# Patient Record
Sex: Male | Born: 1970 | Race: White | Hispanic: No | Marital: Married
Health system: Midwestern US, Academic
[De-identification: ages and names within clinical notes are randomized; demographics above are authoritative.]

## PROBLEM LIST (undated history)

## (undated) DIAGNOSIS — E785 Hyperlipidemia, unspecified: Secondary | ICD-10-CM

## (undated) DIAGNOSIS — K625 Hemorrhage of anus and rectum: Secondary | ICD-10-CM

## (undated) DIAGNOSIS — D126 Benign neoplasm of colon, unspecified: Secondary | ICD-10-CM

## (undated) DIAGNOSIS — M519 Unspecified thoracic, thoracolumbar and lumbosacral intervertebral disc disorder: Secondary | ICD-10-CM

## (undated) DIAGNOSIS — I10 Essential (primary) hypertension: Secondary | ICD-10-CM

## (undated) DIAGNOSIS — M1990 Unspecified osteoarthritis, unspecified site: Secondary | ICD-10-CM

## (undated) DIAGNOSIS — M549 Dorsalgia, unspecified: Secondary | ICD-10-CM

## (undated) DIAGNOSIS — K648 Other hemorrhoids: Secondary | ICD-10-CM

## (undated) DIAGNOSIS — F329 Major depressive disorder, single episode, unspecified: Secondary | ICD-10-CM

## (undated) HISTORY — DX: Hyperlipidemia, unspecified: E78.5

## (undated) HISTORY — DX: Dorsalgia, unspecified: M54.9

## (undated) HISTORY — PX: COLONOSCOPY: SHX174

## (undated) HISTORY — DX: Other hemorrhoids: K64.8

## (undated) HISTORY — DX: Unspecified osteoarthritis, unspecified site: M19.90

## (undated) HISTORY — DX: Major depressive disorder, single episode, unspecified: F32.9

## (undated) HISTORY — DX: Hemorrhage of anus and rectum: K62.5

## (undated) HISTORY — PX: TONSILLECTOMY AND ADENOIDECTOMY: SHX28

## (undated) HISTORY — DX: Benign neoplasm of colon, unspecified: D12.6

## (undated) HISTORY — DX: Unspecified thoracic, thoracolumbar and lumbosacral intervertebral disc disorder: M51.9

## (undated) HISTORY — PX: POLYPECTOMY: SHX149

## (undated) HISTORY — PX: BACK SURGERY: SHX140

## (undated) HISTORY — DX: Essential (primary) hypertension: I10

---

## 1998-09-15 ENCOUNTER — Emergency Department (HOSPITAL_COMMUNITY): Admission: EM | Admit: 1998-09-15 | Discharge: 1998-09-15 | Payer: Self-pay | Admitting: Internal Medicine

## 2001-06-18 ENCOUNTER — Emergency Department (HOSPITAL_COMMUNITY): Admission: EM | Admit: 2001-06-18 | Discharge: 2001-06-18 | Payer: Self-pay

## 2002-06-07 ENCOUNTER — Emergency Department (HOSPITAL_COMMUNITY): Admission: EM | Admit: 2002-06-07 | Discharge: 2002-06-07 | Payer: Self-pay | Admitting: Emergency Medicine

## 2002-06-07 ENCOUNTER — Encounter: Payer: Self-pay | Admitting: Emergency Medicine

## 2003-06-25 ENCOUNTER — Emergency Department (HOSPITAL_COMMUNITY): Admission: EM | Admit: 2003-06-25 | Discharge: 2003-06-25 | Payer: Self-pay | Admitting: *Deleted

## 2003-09-15 ENCOUNTER — Emergency Department (HOSPITAL_COMMUNITY): Admission: EM | Admit: 2003-09-15 | Discharge: 2003-09-15 | Payer: Self-pay | Admitting: Emergency Medicine

## 2004-09-10 ENCOUNTER — Encounter: Admission: RE | Admit: 2004-09-10 | Discharge: 2004-09-10 | Payer: Self-pay | Admitting: Unknown Physician Specialty

## 2004-09-30 ENCOUNTER — Inpatient Hospital Stay (HOSPITAL_COMMUNITY): Admission: RE | Admit: 2004-09-30 | Discharge: 2004-10-03 | Payer: Self-pay | Admitting: Neurological Surgery

## 2004-09-30 ENCOUNTER — Ambulatory Visit: Payer: Self-pay | Admitting: Cardiology

## 2004-10-13 ENCOUNTER — Emergency Department (HOSPITAL_COMMUNITY): Admission: EM | Admit: 2004-10-13 | Discharge: 2004-10-13 | Payer: Self-pay | Admitting: Emergency Medicine

## 2004-11-02 ENCOUNTER — Encounter: Admission: RE | Admit: 2004-11-02 | Discharge: 2004-11-02 | Payer: Self-pay | Admitting: Neurological Surgery

## 2004-11-04 ENCOUNTER — Emergency Department (HOSPITAL_COMMUNITY): Admission: EM | Admit: 2004-11-04 | Discharge: 2004-11-04 | Payer: Self-pay | Admitting: Emergency Medicine

## 2004-11-09 ENCOUNTER — Emergency Department (HOSPITAL_COMMUNITY): Admission: EM | Admit: 2004-11-09 | Discharge: 2004-11-09 | Payer: Self-pay | Admitting: Emergency Medicine

## 2004-11-12 ENCOUNTER — Ambulatory Visit (HOSPITAL_COMMUNITY): Admission: RE | Admit: 2004-11-12 | Discharge: 2004-11-12 | Payer: Self-pay | Admitting: Family Medicine

## 2004-11-13 ENCOUNTER — Encounter: Admission: RE | Admit: 2004-11-13 | Discharge: 2004-11-13 | Payer: Self-pay | Admitting: Neurological Surgery

## 2004-12-08 ENCOUNTER — Encounter: Admission: RE | Admit: 2004-12-08 | Discharge: 2005-01-04 | Payer: Self-pay | Admitting: Neurological Surgery

## 2005-01-01 ENCOUNTER — Encounter: Admission: RE | Admit: 2005-01-01 | Discharge: 2005-01-01 | Payer: Self-pay | Admitting: Neurological Surgery

## 2005-01-04 ENCOUNTER — Encounter: Admission: RE | Admit: 2005-01-04 | Discharge: 2005-01-04 | Payer: Self-pay | Admitting: Neurological Surgery

## 2005-02-12 ENCOUNTER — Emergency Department (HOSPITAL_COMMUNITY): Admission: EM | Admit: 2005-02-12 | Discharge: 2005-02-12 | Payer: Self-pay | Admitting: Emergency Medicine

## 2005-02-24 ENCOUNTER — Encounter: Admission: RE | Admit: 2005-02-24 | Discharge: 2005-02-24 | Payer: Self-pay | Admitting: Neurological Surgery

## 2005-05-10 ENCOUNTER — Encounter: Admission: RE | Admit: 2005-05-10 | Discharge: 2005-05-10 | Payer: Self-pay | Admitting: Neurological Surgery

## 2005-05-17 ENCOUNTER — Encounter: Admission: RE | Admit: 2005-05-17 | Discharge: 2005-05-17 | Payer: Self-pay | Admitting: Neurological Surgery

## 2005-07-12 ENCOUNTER — Encounter: Admission: RE | Admit: 2005-07-12 | Discharge: 2005-07-12 | Payer: Self-pay | Admitting: Neurological Surgery

## 2005-08-16 ENCOUNTER — Encounter: Admission: RE | Admit: 2005-08-16 | Discharge: 2005-08-16 | Payer: Self-pay | Admitting: Neurological Surgery

## 2005-08-26 ENCOUNTER — Encounter: Admission: RE | Admit: 2005-08-26 | Discharge: 2005-11-24 | Payer: Self-pay | Admitting: Neurological Surgery

## 2005-09-21 ENCOUNTER — Encounter: Admission: RE | Admit: 2005-09-21 | Discharge: 2005-09-21 | Payer: Self-pay | Admitting: Neurological Surgery

## 2005-10-28 ENCOUNTER — Emergency Department (HOSPITAL_COMMUNITY): Admission: EM | Admit: 2005-10-28 | Discharge: 2005-10-29 | Payer: Self-pay | Admitting: Emergency Medicine

## 2005-11-01 ENCOUNTER — Encounter: Admission: RE | Admit: 2005-11-01 | Discharge: 2005-11-01 | Payer: Self-pay | Admitting: Neurological Surgery

## 2005-12-21 ENCOUNTER — Ambulatory Visit: Payer: Self-pay | Admitting: Pain Medicine

## 2006-01-03 ENCOUNTER — Ambulatory Visit: Payer: Self-pay | Admitting: Pain Medicine

## 2006-01-06 ENCOUNTER — Ambulatory Visit: Payer: Self-pay | Admitting: Pain Medicine

## 2006-01-19 ENCOUNTER — Encounter: Payer: Self-pay | Admitting: Pain Medicine

## 2006-01-24 ENCOUNTER — Ambulatory Visit: Payer: Self-pay | Admitting: Pain Medicine

## 2006-01-27 ENCOUNTER — Ambulatory Visit: Payer: Self-pay | Admitting: Pain Medicine

## 2006-01-29 ENCOUNTER — Encounter: Payer: Self-pay | Admitting: Pain Medicine

## 2006-02-14 ENCOUNTER — Ambulatory Visit: Payer: Self-pay | Admitting: Pain Medicine

## 2006-05-20 ENCOUNTER — Emergency Department (HOSPITAL_COMMUNITY): Admission: EM | Admit: 2006-05-20 | Discharge: 2006-05-20 | Payer: Self-pay | Admitting: Emergency Medicine

## 2006-06-07 ENCOUNTER — Encounter: Admission: RE | Admit: 2006-06-07 | Discharge: 2006-06-07 | Payer: Self-pay | Admitting: Neurological Surgery

## 2006-06-09 ENCOUNTER — Encounter: Admission: RE | Admit: 2006-06-09 | Discharge: 2006-06-09 | Payer: Self-pay | Admitting: Neurological Surgery

## 2007-05-02 ENCOUNTER — Emergency Department (HOSPITAL_COMMUNITY): Admission: EM | Admit: 2007-05-02 | Discharge: 2007-05-02 | Payer: Self-pay | Admitting: Emergency Medicine

## 2007-05-04 ENCOUNTER — Ambulatory Visit: Payer: Self-pay | Admitting: Gastroenterology

## 2007-05-22 ENCOUNTER — Encounter: Payer: Self-pay | Admitting: Gastroenterology

## 2007-05-22 ENCOUNTER — Ambulatory Visit (HOSPITAL_COMMUNITY): Admission: RE | Admit: 2007-05-22 | Discharge: 2007-05-22 | Payer: Self-pay | Admitting: Gastroenterology

## 2007-05-22 DIAGNOSIS — D126 Benign neoplasm of colon, unspecified: Secondary | ICD-10-CM

## 2007-05-22 HISTORY — DX: Benign neoplasm of colon, unspecified: D12.6

## 2007-05-29 ENCOUNTER — Ambulatory Visit: Payer: Self-pay | Admitting: Gastroenterology

## 2007-05-31 NOTE — Unmapped (Signed)
Signed by Eusebio Friendly MD on 05/31/2007 at 14:32:25    Prescriptions:  ADDERALL XR 15 MG CP24 (AMPHETAMINE-DEXTROAMPHETAMINE) one by mouth daily  #90 x 0   Entered and Authorized by: Eusebio Friendly MD   Signed by: Eusebio Friendly MD on 05/31/2007   Method used: Handwritten   RxID: [id number]

## 2007-06-17 DIAGNOSIS — K625 Hemorrhage of anus and rectum: Secondary | ICD-10-CM

## 2007-06-17 DIAGNOSIS — K648 Other hemorrhoids: Secondary | ICD-10-CM | POA: Insufficient documentation

## 2007-06-17 DIAGNOSIS — M549 Dorsalgia, unspecified: Secondary | ICD-10-CM | POA: Insufficient documentation

## 2007-06-17 HISTORY — DX: Hemorrhage of anus and rectum: K62.5

## 2007-06-17 HISTORY — DX: Other hemorrhoids: K64.8

## 2007-06-17 HISTORY — DX: Dorsalgia, unspecified: M54.9

## 2007-10-31 NOTE — Unmapped (Signed)
Signed by Tracie Harrier on 11/01/2007 at 15:29:53    Phone Note   Patient Call  [facility] Cell Phone #: [phone]  Caller: patient  Call for: Dr Suezanne Cheshire     Reason for Call: refill medication  Summary of Call: Would like written script for Adderall 15 mg, 1 x a day, 90 day supply. Please call patient back and advise at [phone] when script is ready for pickup. Thank you.    Current Allergies:   NKA      Initial call taken by: Brand Males Back,  October 31, 2007 1:05 PM      Follow-up for Phone Call   left message for patient  Follow-up by: Tracie Harrier,  November 01, 2007 3:29 PM    Prescriptions:  ADDERALL XR 15 MG CP24 (AMPHETAMINE-DEXTROAMPHETAMINE) one by mouth daily  #90 x 0   Entered and Authorized by: Eusebio Friendly MD   Signed by: Eusebio Friendly MD on 11/01/2007   Method used: Handwritten   RxID: [id number]

## 2007-11-01 NOTE — Unmapped (Signed)
Signed by Eusebio Friendly MD on 11/01/2007 at 00:00:00  Rx Refill adderall      Imported By: Dorena Cookey 11/01/2007 15:39:02    _____________________________________________________________________    External Attachment:    Please see [facility] EMR for this document.

## 2007-12-27 NOTE — Unmapped (Signed)
Signed by Eugenie Birks MA on 12/27/2007 at 13:18:40    Phone Note   Patient Call  Call back at Home Phone: [phone]  Caller: spouse - Dawn  Call for: Suezanne Cheshire    Reason for Call: returning call to provider  Summary of Call: Pt exposed to whooping cough.  Tetanus was given in 1999.  Does pt need another one?     Initial call taken by: Eugenie Birks MA,  December 27, 2007 12:16 PM      New Medications:  ZITHROMAX Z-PAK TABS (AZITHROMYCIN TABS)     Follow-up for Phone Call   Pt needs ADACEL ASAP to bump up pertusis immunity.  Please tell the patient that prescription will be called into pharmacy.   Follow-up by: Eusebio Friendly MD,  December 27, 2007 12:18 PM    Additional Follow-up for Phone Call   left message for patient, Rx completed  Additional Follow-up by: Eugenie Birks MA,  December 27, 2007 1:18 PM    Prescriptions:  Christena Deem Z-PAK TABS (AZITHROMYCIN TABS)   #1 x 0   Entered and Authorized by: Eusebio Friendly MD   Signed by: Eusebio Friendly MD on 12/27/2007   Method used: Telephoned to ...        RxID: [id number]

## 2007-12-27 NOTE — Unmapped (Signed)
Signed by Eugenie Birks MA on 12/27/2007 at 17:10:16    Phone Note   Patient Call  Call back at Home Phone: [phone]  Caller: patient  Call for: donna    Reason for Call: speak with nurse  Summary of Call: does pt need to have a tetanus shot today? no labs are left, do you want to fit him in?  please call pt to advise.     Initial call taken by: Clementeen Graham,  December 27, 2007 2:31 PM      Follow-up for Phone Call   phone call completed, appointment scheduled today  Follow-up by: Eugenie Birks MA,  December 27, 2007 5:09 PM

## 2007-12-27 NOTE — Unmapped (Addendum)
Signed by Eugenie Birks MA on 12/27/2007 at 17:12:45        Allergies  Allergies have not been documented for this patient    Medications               Assessment and Plan   Today's Orders   Admin Fee Immun lst Inj. [CPT-90471]  Tetanus vaccine  (V03.7) [CPT-90703]                Tetanus/Td Vaccine     Vaccine Type: Adacel     Date/Time: 12/27/2007 5:11 PM     Site: right deltoid     Mfr: [employer]     Dose: 0.5 ml     Route: IM     Given by: Eugenie Birks MA     Exp. Date: 05/02/2010     Lot #: [id number]     VIS given: 12/09/04 version given December 27, 2007.      Signed by Eugenie Birks MA on 12/28/2007 at 14:47:14            Signed by Eugenie Birks MA on 12/28/2007 at 15:22:40              Allergies  Allergies have not been documented for this patient    Medications               Assessment and Plan   Today's Orders   Tdap vacc    79-39 years old  (V06.1) [CPT-90715]

## 2008-01-24 ENCOUNTER — Inpatient Hospital Stay (HOSPITAL_COMMUNITY): Admission: EM | Admit: 2008-01-24 | Discharge: 2008-01-25 | Payer: Self-pay | Admitting: Emergency Medicine

## 2008-01-30 NOTE — Unmapped (Signed)
Signed by Beverley Fiedler on 01/31/2008 at 08:56:03    PHONE NOTE - Patient Call    Call back at Home Phone: [phone]  Caller: patient  Department: Family Medicine  Call for: DR [facility]    Reason for Call: refill medication. PT CALLED AND SAYS THAT SHE NEEDS A WRITTEN SCRIPT FOR THE FOLLOWING MEDICATION   ADDERALL XR 15 MG CP24 (AMPHETAMINE-DEXTROAMPHETAMINE) one by mouth daily  #90 x 0  PLZ CALL WHEN READY       Initial call taken by: Fleet Contras,  January 30, 2008 2:52 PM    Current Allergies:   NKA      FOLLOW UP  Ask pt to be seen soon.  I have not seen him since 01-09-07.  Please have patient pick-up written prescription at the front desk.       Follow-up by: Eusebio Friendly MD,  January 31, 2008 8:24 AM    ADDITIONAL FOLLOW UP  Left message for patient to pick up script.  Follow-up by: Beverley Fiedler,  January 31, 2008 8:55 AM    Prescriptions:  ADDERALL XR 15 MG CP24 (AMPHETAMINE-DEXTROAMPHETAMINE) one by mouth daily  #30 x 0   Entered and Authorized by: Eusebio Friendly MD   Signed by: Eusebio Friendly MD on 01/31/2008   Method used: Print then Give to Patient   RxID: [id number]

## 2008-02-20 NOTE — Unmapped (Signed)
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  Social History: Marital Status: divorced,   Children: 1,   Caffeine per Day: 1  Alcohol Use: occasionally  Drug Use: none  Tobacco Usage:non-smoker

## 2008-02-21 NOTE — Unmapped (Signed)
Signed by Eusebio Friendly MD on 02/21/2008 at 12:01:01      Reason for Visit   Chief Complaint: Check up     History from: patient    Allergies  ! PCN    Medications   ADDERALL XR 15 MG CP24 (AMPHETAMINE-DEXTROAMPHETAMINE) one by mouth daily       Vital Signs:   Wt: 182 lbs.      Temperature: 98.0  degrees F  oral  Pulse: 68    Patient appears to be in acute distress: no  BP: 114/68    Intake recorded by: Eugenie Birks MA on February 21, 2008 11:53 AM    History of Present Illness   father in ill health due to liver problem with alcohol    sleep good, concentration OK      Past History  Family History: Father - alcoholic liver disease    Review of Systems  Refer to HPI for review of systems documentation.      Physical Examination:   BP: 114/  68           New Problems:  ADD (ICD-314.00)  New Medications:  IBUPROFEN 800 MG TABS (IBUPROFEN) one by mouth every 8 hours as needed  ROBAXIN 500 MG TABS (METHOCARBAMOL) one to two by mouth four times a day as needed spasm      Preventive Maintenance             Prescriptions:  ADDERALL XR 15 MG CP24 (AMPHETAMINE-DEXTROAMPHETAMINE) one by mouth daily  #90 x 0   Entered and Authorized by: Eusebio Friendly MD   Signed by: Eusebio Friendly MD on 02/21/2008   Method used: Print then Give to Patient   RxID: [id number]  ADDERALL XR 15 MG CP24 (AMPHETAMINE-DEXTROAMPHETAMINE) one by mouth daily  #30 x 0   Entered and Authorized by: Eusebio Friendly MD   Signed by: Eusebio Friendly MD on 02/21/2008   Method used: Print then Give to Patient   RxID: [id number]  ROBAXIN 500 MG TABS (METHOCARBAMOL) one to two by mouth four times a day as needed spasm  #40 x 3   Entered and Authorized by: Eusebio Friendly MD   Signed by: Eusebio Friendly MD on 02/21/2008   Method used: Print then Give to Patient   RxID: [id number]  IBUPROFEN 800 MG TABS (IBUPROFEN) one by mouth every 8 hours as needed  #100 x 3   Entered and Authorized by: Eusebio Friendly MD   Signed by: Eusebio Friendly MD on  02/21/2008   Method used: Print then Give to Patient   RxID: [id number]      Assessment and Plan   Problems  HYPERCHOLESTEROLEMIA, PURE (ICD-272.0) just got with new insurance  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     Problems New Problems:  Dx of ADD (ICD-314.00)  Onset: 02/21/2008    Medications   New Prescriptions/Refills:  ADDERALL XR 15 MG CP24 (AMPHETAMINE-DEXTROAMPHETAMINE) one by mouth daily  #90 x 0, 02/21/2008, Eusebio Friendly MD  ADDERALL XR 15 MG CP24 (AMPHETAMINE-DEXTROAMPHETAMINE) one by mouth daily  #30 x 0, 02/21/2008, Eusebio Friendly MD  ROBAXIN 500 MG TABS (METHOCARBAMOL) one to two by mouth four times a day as needed spasm  #40 x 3, 02/21/2008, Eusebio Friendly MD  IBUPROFEN 800 MG TABS (IBUPROFEN) one by mouth every 8 hours as needed  #100 x 3, 02/21/2008, Eusebio Friendly MD  Disposition:   Return to clinic for Doctor Visit in 6 month(s)   Appointment Reason: CHECK-UP 15 minutes ADD

## 2008-02-21 NOTE — Unmapped (Signed)
Signed by Eusebio Friendly MD on 02/26/2008 at 15:06:13        Allergies  ! PCN    Medications                       Preventive Maintenance               Assessment and Plan   Today's Orders   [phone] - Ofc Vst, Est Level II [WJX-91478]

## 2008-03-19 NOTE — Unmapped (Signed)
Signed by Eusebio Friendly MD on 03/19/2008 at 00:00:00  To Send Records to [facility]      Imported By: Ellene Route Hardig 03/27/2008 15:19:44    _____________________________________________________________________    External Attachment:    Please see [facility] EMR for this document.

## 2008-06-03 NOTE — Unmapped (Signed)
Signed by Jodell Cipro MA on 06/03/2008 at 16:48:45    PHONE NOTE - Patient Call    Caller's Cell Phone #: [phone]   Caller: patient  Department: Family Medicine  Call for: DR [facility]     Reason for Call: refill medication. PT CALLED AND SAYS THAT SHE NEEDS A WRITTEN SCRIPT FOR THE FOLLOWING MEDICATION   ADDERALL XR 15 MG CP24 (AMPHETAMINE-DEXTROAMPHETAMINE) one by mouth daily  #90 x 0  PLZ CALL WHEN READY [phone]          Initial call taken by: Fleet Contras,  June 03, 2008 10:56 AM    Current Allergies:   ! PCN    FOLLOW UP  Please have patient pick-up written prescription at the front desk.       Follow-up by: Eusebio Friendly MD,  June 03, 2008 4:27 PM    ADDITIONAL FOLLOW UP  CALLED PT AND LET HIM KNOW  Follow-up by: Jodell Cipro MA,  June 03, 2008 4:48 PM

## 2008-06-04 NOTE — Unmapped (Addendum)
Signed by Royston Sinner MA on 06/04/2008 at 15:32:22    Prescriptions:  ADDERALL XR 15 MG CP24 (AMPHETAMINE-DEXTROAMPHETAMINE) one by mouth daily  #30 x 0   Entered by: Royston Sinner MA   Authorized by: Kingsley Callander MD   Signed by: Royston Sinner MA on 06/04/2008   Method used: Print then Give to Patient   RxID: [id number]      Signed by Beverley Fiedler on 06/04/2008 at 15:34:18    Patient picked up script for 30 pills but said it should have been for 90.

## 2008-07-08 NOTE — Unmapped (Signed)
Signed by Jodell Cipro MA on 07/08/2008 at 13:50:18    PHONE NOTE  Caller's Cell Phone #: [phone]  Caller: patient  Call for: DR [facility]    Reason for Call: PT NEEDS REFILL ON ADDERALL XR 15 MG SIG: 1 by mouth daily #30 X0 REFILL.  CALL PT TO PICK UP RX.      Initial call taken by: Jodell Cipro MA,  July 08, 2008 1:35 PM      New Medications:  ADDERALL XR 15 MG CP24 (AMPHETAMINE-DEXTROAMPHETAMINE) one by mouth daily (OK fill 08/07/08)    FOLLOW UP  Please have patient pick-up written prescription at the front desk.       Follow-up by:  Eusebio Friendly MD,  July 08, 2008 1:36 PM    FOLLOW UP  left a message to let pt know script has been called in to the pharmacy  Follow-up by:  Daleen Snook,  July 08, 2008 1:40 PM    FOLLOW UP  CALLED PT AND LET HIM KNOW  Follow-up by:  Jodell Cipro MA,  July 08, 2008 1:50 PM    Prescriptions:  ADDERALL XR 15 MG CP24 (AMPHETAMINE-DEXTROAMPHETAMINE) one by mouth daily (OK fill 08/07/08)  #30 x 0   Entered and Authorized by: Eusebio Friendly MD   Signed by: Eusebio Friendly MD on 07/08/2008   Method used: Print then Give to Patient   RxID: [id number]  ADDERALL XR 15 MG CP24 (AMPHETAMINE-DEXTROAMPHETAMINE) one by mouth daily  #30 x 0   Entered and Authorized by: Eusebio Friendly MD   Signed by: Eusebio Friendly MD on 07/08/2008   Method used: Print then Give to Patient   RxID: [id number]

## 2008-09-12 NOTE — Unmapped (Signed)
Signed by Lonni Fix on 09/12/2008 at 14:02:03    PHONE NOTE  Call back at Home Phone: [phone]  Caller: patient  Department: Family Medicine  Call for: DR [facility]     Reason for Call: refill medication. PT CALLED AND SAYS THAT SHE NEEDS A WRITTEN SCRIPT FOR THE FOLLOWING MEDICATION PT NEEDS REFILL ON ADDERALL XR 15 MG SIG: 1 by mouth daily #30 X0 REFILL.  CALL PT TO PICK UP RX. [id number]        Initial call taken by: Fleet Contras,  September 12, 2008 11:30 AM    Current Allergies:   ! PCN    FOLLOW UP  Please schedule check-up soon.  Please have patient pick-up written prescription at the front desk.       Follow-up by:  Eusebio Friendly MD,  September 12, 2008 1:33 PM    FOLLOW UP  left message for patient to pick up script and schedule appt soon  Follow-up by:  Lonni Fix,  September 12, 2008 2:01 PM    Prescriptions:  ADDERALL XR 15 MG CP24 (AMPHETAMINE-DEXTROAMPHETAMINE) one by mouth daily (OK fill 08/07/08)  #30 x 0   Entered and Authorized by: Eusebio Friendly MD   Signed by: Eusebio Friendly MD on 09/12/2008   Method used: Print then Give to Patient   RxID: [id number]

## 2008-10-05 NOTE — Unmapped (Signed)
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 PCN      Preventive Maintenance               Tetanus/Td Vaccine     Vaccine Type: Historical     Date/Time: 10/05/2008 11:41 AM     Dose: 0.5 ml     Given by: Eugenie Birks MA     VIS given: 04/18/07 version given Oct 15, 2008.

## 2008-10-15 NOTE — Unmapped (Signed)
Signed by Eusebio Friendly MD on 10/15/2008 at 00:00:00  Privacy Notice      Imported By: Alona Bene 10/15/2008 15:59:07    _____________________________________________________________________    External Attachment:    Please see [facility] EMR for this document.

## 2008-10-15 NOTE — Unmapped (Signed)
Signed by Eusebio Friendly MD on 10/15/2008 at 12:00:55    Prescriptions:  ADDERALL XR 15 MG CP24 (AMPHETAMINE-DEXTROAMPHETAMINE) one by mouth daily (OK fill now)  #30 x 0   Entered and Authorized by: Eusebio Friendly MD   Signed by: Eusebio Friendly MD on 10/15/2008   Method used: Print then Give to Patient   RxID: [id number]  VALTREX 1 GM TABS (VALACYCLOVIR HCL) two by mouth at onset of cold sore then repeat twelve  hours later (4 total)  #30 x 1   Entered and Authorized by: Eusebio Friendly MD   Signed by: Eusebio Friendly MD on 10/15/2008   Method used: Print then Give to Patient   RxID: [id number]

## 2008-10-15 NOTE — Unmapped (Addendum)
Signed by Eusebio Friendly MD on 10/15/2008 at 11:58:55      Reason for Visit   Chief Complaint: Check up Hypercholesterolemia    History from: patient    Allergies  ! PCN    Medications   ADDERALL XR 15 MG CP24 (AMPHETAMINE-DEXTROAMPHETAMINE) one by mouth daily (OK fill 08/07/08)  IBUPROFEN 800 MG TABS (IBUPROFEN) one by mouth every 8 hours as needed  ROBAXIN 500 MG TABS (METHOCARBAMOL) one to two by mouth four times a day as needed spasm       Vital Signs:   Wt: 182 lbs.      Wt chg (lbs): 0  Temperature: 98.6  degrees F  oral  Pulse: 68    Patient appears to be in acute distress: no  BP: 116/64    Intake recorded by: Eugenie Birks MA on Oct 15, 2008 11:39 AM    History of Present Illness   had BW for life ins- thinks TG was about 425  mood and sleep good, ? wears off a little earlier in day but not problematic.  ROS pt denies: chest pain, shortness of breath, leg swelling  urinary problems, bowel symptoms  some trouble maintaining erection        Review of Systems  Refer to HPI for review of systems documentation.      Physical Examination:   BP: 116/  64    Physical Exam- Detail:   General Appearance: well-developed, well-nourished and in no acute distress.  Respiratory: Respiration un-labored.  Lung fields clear to auscultation.  No wheezing, rales, rhonchi or pleural rub.  Cardiac: S1 and S2 normal.  RRR without murmurs, rubs, gallops.  No JVD.  Psychiatric: Judgement and insight are within normal limits.  Alert and oriented x3.  No mood disorders noted, appropriate affect.           New Problems:  HYPERTRIGLYCERIDEMIA (ICD-272.1)  New Medications:  ADDERALL XR 15 MG CP24 (AMPHETAMINE-DEXTROAMPHETAMINE) one by mouth daily (OK fill now)  ADDERALL XR 15 MG CP24 (AMPHETAMINE-DEXTROAMPHETAMINE) one by mouth daily (OK fill 11/15/08)  VALTREX 1 GM TABS (VALACYCLOVIR HCL) two by mouth at onset of cold sore then repeat twelve  hours later (4 total)      Preventive Maintenance             Prescriptions:  VALTREX 1 GM TABS  (VALACYCLOVIR HCL) two by mouth at onset of cold sore then repeat twelve  hours later (4 total)  #30 x 1   Entered and Authorized by: Eusebio Friendly MD   Signed by: Eusebio Friendly MD on 10/15/2008   Method used: Print then Give to Patient   RxID: [id number]  ADDERALL XR 15 MG CP24 (AMPHETAMINE-DEXTROAMPHETAMINE) one by mouth daily (OK fill now)  #30 x 0   Entered and Authorized by: Eusebio Friendly MD   Signed by: Eusebio Friendly MD on 10/15/2008   Method used: Print then Give to Patient   RxID: [id number]  ADDERALL XR 15 MG CP24 (AMPHETAMINE-DEXTROAMPHETAMINE) one by mouth daily (OK fill 11/15/08)  #90 x 0   Entered and Authorized by: Eusebio Friendly MD   Signed by: Eusebio Friendly MD on 10/15/2008   Method used: Print then Give to Patient   RxID: [id number]  VALTREX 1 GM TABS (VALACYCLOVIR HCL) two by mouth at onset of cold sore then repeat twelve  hours later (4 total)  #8 x 1   Entered and Authorized by: Eusebio Friendly MD  Signed by: Eusebio Friendly MD on 10/15/2008   Method used: Print then Give to Patient   RxID: [id number]      Assessment and Plan   HYPERTRIGLYCERIDEMIA (ICD-272.1) ondiet, needs to check  ADD (ICD-314.00) stable with current medication regimen.   ? 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, had sugar ckd recently       Problems New Problems:  Dx of HYPERTRIGLYCERIDEMIA (ICD-272.1)  Onset: 10/15/2008    Medications   New Prescriptions/Refills:  VALTREX 1 GM TABS (VALACYCLOVIR HCL) two by mouth at onset of cold sore then repeat twelve  hours later (4 total)  #30 x 1, 10/15/2008, Eusebio Friendly MD  ADDERALL XR 15 MG CP24 (AMPHETAMINE-DEXTROAMPHETAMINE) one by mouth daily (OK fill now)  #30 x 0, 10/15/2008, Eusebio Friendly MD  ADDERALL XR 15 MG CP24 (AMPHETAMINE-DEXTROAMPHETAMINE) one by mouth daily (OK fill 11/15/08)  #90 x 0, 10/15/2008, Eusebio Friendly MD  VALTREX 1 GM TABS (VALACYCLOVIR HCL) two by mouth at onset of cold sore then repeat twelve  hours later (4 total)  #8 x 1,  10/15/2008, Eusebio Friendly MD    Today's Orders   [phone] - Ofc Vst, Est Level III [IOE-70350]    Disposition:   Return to clinic for Lab Appointment Reason: fasting blood work   Return to clinic for Doctor Visit in 6 month(s)   Appointment Reason: CHECK-UP 15 minutes                         Signed by Eusebio Friendly MD on 10/19/2008 at 10:33:38              Allergies  ! PCN    Medications                       Preventive Maintenance               Assessment and Plan   Today's Orders   Lipid Profile   (FATS) (7600) [CPT-80061]  Hepatic Function    (LIVP) (10256)  [CPT-80076]  Glucose   (GLU) (483) [CPT-82947]

## 2008-10-15 NOTE — Unmapped (Signed)
Signed by Eusebio Friendly MD on 10/15/2008 at 00:00:00  Disclosure      Imported By: Alona Bene 10/15/2008 15:58:36    _____________________________________________________________________    External Attachment:    Please see [facility] EMR for this document.

## 2008-10-19 LAB — HEPATIC FUNCTION PANEL
ALT: 12 units/L (ref 10–50)
AST: 15 units/L (ref 5–34)
Albumin: 4.6 g/dL (ref 3.5–5.2)
Alkaline Phosphatase: 73 units/L (ref 53–128)
Bilirubin, Direct: 0.3 mg/dL (ref 0.0–0.2)
Total Bilirubin: 0.8 mg/dL (ref 0.3–1.2)
Total Protein: 6.7 g/dL (ref 6.0–8.3)

## 2008-10-19 LAB — LIPID PANEL
Chol/HDL Ratio: 6.9 (ref 0.0–4.0)
Cholesterol, Total: 179 mg/dL (ref 0–200)
HDL: 26 mg/dL (ref >40)
Triglycerides: 119 mg/dL (ref 0–150)

## 2008-10-19 LAB — LIPOPROTEIN ELECTROPHORESIS: LDL Calculated: 129 mg/dL (ref 0–100)

## 2008-10-19 LAB — GLUCOSE, RANDOM: Glucose: 99 mg/dL (ref 70–110)

## 2008-10-19 NOTE — Unmapped (Signed)
Signed by Eusebio Friendly MD on 10/21/2008 at 15:47:08  Patient: Edward Tanner  Note: All result statuses are Final unless otherwise noted.    Tests: (1) HEPATIC (1040)    Order Note: FASTING STATUS:  UNKNOWN    Order Note:     T.PROTEIN                 6.7 g/dL                    1.6-1.0    ALBUMIN                   4.6 g/dL                    9.6-0.4    ALK PHOS                  73 U/L                      53-128    ALT                       12 U/L                      10-50    AST                       15 U/L                      5-34    T. BILI                   0.8 mg/dL                   5.4-0.9    D.BILI               [H]  0.3 mg/dL                   8.1-1.9    Note: An exclamation mark (!) indicates a result that was not dispersed into   the flowsheet.  Document Creation Date: 10/21/2008 11:33 AM  _______________________________________________________________________    (1) Order result status: Final  Collection or observation date-time: 10/19/2008 07:45  Requested date-time:   Receipt date-time: 10/19/2008 14:47  Reported date-time:   Referring Physician:    Ordering Physician: [name] M.D [facility])  Specimen Source:   Source: Lajuana Carry Order Number: [id number]  Lab site:

## 2008-10-19 NOTE — Unmapped (Signed)
Signed by Eusebio Friendly MD on 10/21/2008 at 15:47:08  Patient: Edward Tanner  Note: All result statuses are Final unless otherwise noted.    Tests: (1) GLUCOSE (1065)    Order Note: FASTING STATUS:  UNKNOWN    Order Note: Comments:     Confidential Patient Information: Accompanied are [facility] results that are being   delivered by [facility].  If you receive a clinical result for a patient that is not yours please   contact [facility] at [phone].    GLUCOSE                   99 mg/dL                    09-811    Note: An exclamation mark (!) indicates a result that was not dispersed into   the flowsheet.  Document Creation Date: 10/21/2008 11:33 AM  _______________________________________________________________________    (1) Order result status: Final  Collection or observation date-time: 10/19/2008 07:45  Requested date-time:   Receipt date-time: 10/19/2008 14:47  Reported date-time:   Referring Physician:    Ordering Physician: [name] M.D [facility])  Specimen Source:   Source: Lajuana Carry Order Number: [id number]  Lab site:

## 2008-10-19 NOTE — Unmapped (Signed)
Signed by Eugenie Birks MA on 10/21/2008 at 13:51:18        Allergies  ! PCN    Medications               Assessment and Plan   Today's Orders   Venipuncture [[id number]]              ]

## 2008-10-19 NOTE — Unmapped (Signed)
Signed by Eusebio Friendly MD on 10/21/2008 at 15:47:08  Patient: Edward Tanner  Note: All result statuses are Final unless otherwise noted.    Tests: (1) Calc LDL (1058)    Order Note: FASTING STATUS:  UNKNOWN    Order Note:     CLDL                 [H]  129 mg/dL                   0-981    Note: An exclamation mark (!) indicates a result that was not dispersed into   the flowsheet.  Document Creation Date: 10/21/2008 11:33 AM  _______________________________________________________________________    (1) Order result status: Final  Collection or observation date-time: 10/19/2008 07:45  Requested date-time:   Receipt date-time: 10/19/2008 14:47  Reported date-time:   Referring Physician:    Ordering Physician: [name] M.D [facility])  Specimen Source:   Source: Lajuana Carry Order Number: [id number]  Lab site:

## 2008-10-19 NOTE — Unmapped (Signed)
Signed by Eusebio Friendly MD on 10/21/2008 at 15:47:08  Patient: Edward Tanner  Note: All result statuses are Final unless otherwise noted.    Tests: (1) Lipid Panel (1055)    Order Note: FASTING STATUS:  UNKNOWN    Order Note:     CHOLESTEROL               179 mg/dL                   6-045    TRIGLYCERIDES             119 mg/dL                   4-098    CHOL/HDL RATIO       [H]  6.9                         0.0-4.0    HDL                  [L]  26 mg/dL                    >11    Note: An exclamation mark (!) indicates a result that was not dispersed into   the flowsheet.  Document Creation Date: 10/21/2008 11:33 AM  _______________________________________________________________________    (1) Order result status: Final  Collection or observation date-time: 10/19/2008 07:45  Requested date-time:   Receipt date-time: 10/19/2008 14:47  Reported date-time:   Referring Physician:    Ordering Physician: [name] M.D [facility])  Specimen Source:   Source: Lajuana Carry Order Number: [id number]  Lab site:

## 2008-10-21 NOTE — Unmapped (Signed)
Signed by Eusebio Friendly MD on 10/21/2008 at 15:48:08          [name], M.D.  [facility]  [address]  [place]  [address]  Phone: [phone]  Fax: [phone]     Oct 21, 2008      Edward Tanner  [address]    [place], [place] [address]                                                                                           RE:  TEST RESULTS   [name]--05-Jun-1970)         Dear [name]:      The following is an interpretation of your most recent tests.  Please take note of any instructions provided.  Electrolyte studies:  Blood sugar normal    Liver function studies:  Normal    Lipid panel:  Good          Triglyceride: 119   Cholesterol: 179   LDL, calculated: 129   HDL: 26   Chol/HDL%:  6.9       Additional Comments: Please schedule a complete physical in 6 months with Dr. Suezanne Cheshire.     Keep up the good work!           Sincerely,         Eusebio Friendly, M.D.

## 2008-10-31 ENCOUNTER — Emergency Department (HOSPITAL_COMMUNITY): Admission: EM | Admit: 2008-10-31 | Discharge: 2008-10-31 | Payer: Self-pay | Admitting: *Deleted

## 2008-11-15 NOTE — Unmapped (Signed)
Signed by Aleen Sells MA on 11/15/2008 at 11:25:56    PHONE NOTE  Caller's Cell Phone #: [phone]  Call for: Dr. Suezanne Cheshire    Reason for Call: refill medication. PT needs 30 Rx on Aderol. Says he mailed Rx for 90 day Rx and it wasn't received. If you need to speak with PT pls call at [phone]    Pharmacy Information: [facility] in [place]      Initial call taken by: Simon Rhein,  November 15, 2008 8:52 AM    Current Allergies:   ! PCN    FOLLOW UP  Please have patient pick-up written prescription at the front desk.       Follow-up by:  Eusebio Friendly MD,  November 15, 2008 9:22 AM    FOLLOW UP  patient advised  Follow-up by:  Aleen Sells MA,  November 15, 2008 11:25 AM    Prescriptions:  ADDERALL XR 15 MG CP24 (AMPHETAMINE-DEXTROAMPHETAMINE) one by mouth daily (OK fill now)  #30 x 0   Entered and Authorized by: Eusebio Friendly MD   Signed by: Eusebio Friendly MD on 11/15/2008   Method used: Print then Give to Patient   RxID: [id number]

## 2008-12-03 NOTE — Unmapped (Signed)
Signed by Eusebio Friendly MD on 12/03/2008 at 13:16:06      Reason for Visit   Chief Complaint: lweft ear pain and headache had drg yesterday    History from: patient    Allergies  ! PCN    Medications   ADDERALL XR 15 MG CP24 (AMPHETAMINE-DEXTROAMPHETAMINE) one by mouth daily (OK fill now)  IBUPROFEN 800 MG TABS (IBUPROFEN) one by mouth every 8 hours as needed  ROBAXIN 500 MG TABS (METHOCARBAMOL) one to two by mouth four times a day as needed spasm  VALTREX 1 GM TABS (VALACYCLOVIR HCL) two by mouth at onset of cold sore then repeat twelve  hours later (4 total)        Vital Signs:   Wt: 180 lbs.      Wt chg (lbs): -2  Temperature: 98.8  Pulse: 76 (regular)  BP: 120/80    Intake recorded by: Alanda Amass MA on December 03, 2008 1:01 PM    History of Present Illness   x 1 d, drainage x 1 d, sore, hurts to chew  HA, OTC advil 400 no help  no URI symptoms, no fever  [place]          Physical Examination:   BP: 120/  80    Physical Exam- Detail:   General Appearance: well-developed, well-nourished and in no acute distress.  Ears: R clear, Left canal red  Oropharynx: Normal appearance.  No erythema, exudate or mass. No tonsillar swelling.  Oral Cavity: Gums pink, good dentition.  Oral mucosa and tongue without lesions.  Respiratory: Respiration un-labored.  Lung fields clear to auscultation.  No wheezing, rales, rhonchi or pleural rub.  Neck: No thyromegaly.  No nodules, masses or tenderness.  Lymphatic: Areas palpated not enlarged:  cervical, supraclavicular.  Cardiac: S1 and S2 normal.  RRR without murmurs, rubs, gallops.  No JVD.           New Problems:  OTITIS EXTERNA, INFECTIVE (ICD-380.10)  New Medications:  FLOXIN OTIC 0.3 % SOLN (OFLOXACIN) ten drops to affected ear twice a day  *        Preventive Maintenance             Prescriptions:  BIAXIN 500 MG TABS (CLARITHROMYCIN) one by mouth twice a day  #20 x 0   Entered and Authorized by: Eusebio Friendly MD   Signed by: Eusebio Friendly MD on 12/03/2008   Method  used: Print then Give to Patient   RxID: [id number]  FLOXIN OTIC 0.3 % SOLN (OFLOXACIN) ten drops to affected ear twice a day  #10 days x 0   Entered and Authorized by: Eusebio Friendly MD   Signed by: Eusebio Friendly MD on 12/03/2008   Method used: Print then Give to Patient   RxID: [id number]      Assessment and Plan     Problems New Problems:  Dx of OTITIS EXTERNA, INFECTIVE (ICD-380.10)  Onset: 12/03/2008    Medications   New Prescriptions/Refills:  BIAXIN 500 MG TABS (CLARITHROMYCIN) one by mouth twice a day  #20 x 0, 12/03/2008, Eusebio Friendly MD  FLOXIN OTIC 0.3 % SOLN (OFLOXACIN) ten drops to affected ear twice a day  #10 days x 0, 12/03/2008, Eusebio Friendly MD    Today's Orders   [phone] - Ofc Vst, Est Level II [OZD-66440]    Patient Instructions   May take ibuprofen 200 mg (Advil/Motrin) up to 4 tabs every  8 hours.  May also take Tylenol/acetaminophen as directed on package.   Please finish taking all the antibiotics.     Disposition:   as needed

## 2009-01-16 ENCOUNTER — Emergency Department (HOSPITAL_COMMUNITY): Admission: EM | Admit: 2009-01-16 | Discharge: 2009-01-16 | Payer: Self-pay | Admitting: Emergency Medicine

## 2009-02-26 NOTE — Unmapped (Signed)
Signed by Charissa Bash MA on 02/27/2009 at 09:45:07    PHONE NOTE  Caller's Cell Phone #: [phone]  Caller: patient  Department: Family Medicine  Call for: dr.Rosetta Rupnow    Reason for Call: refill medication. pt needs a refill on Current Meds:   ADDERALL XR 15 MG CP24   please call whe ready.      Initial call taken by: Anette Guarneri,  February 26, 2009 2:48 PM    Current Allergies:   ! PCN    FOLLOW UP  Please have patient pick-up written prescription at the front desk.       Follow-up by:  Eusebio Friendly MD,  February 27, 2009 8:10 AM    FOLLOW UP  patient advised, phone call completed, Rx completed, Rx ready for pick-up  Follow-up by:  Charissa Bash MA,  February 27, 2009 9:44 AM    Prescriptions:  ADDERALL XR 15 MG CP24 (AMPHETAMINE-DEXTROAMPHETAMINE) one by mouth daily (OK fill now)  #30 x 0   Entered and Authorized by: Eusebio Friendly MD   Signed by: Eusebio Friendly MD on 02/27/2009   Method used: Print then Give to Patient   RxID: [id number]

## 2009-03-27 NOTE — Unmapped (Signed)
Signed by Eusebio Friendly MD on 03/27/2009 at 14:11:29    Prescriptions:  ADDERALL XR 15 MG CP24 (AMPHETAMINE-DEXTROAMPHETAMINE) one by mouth daily (OK fill now)  #90 x 0   Entered and Authorized by: Eusebio Friendly MD   Signed by: Eusebio Friendly MD on 03/27/2009   Method used: Print then Give to Patient   RxID: [id number]

## 2009-04-07 NOTE — Unmapped (Signed)
Signed by Eusebio Friendly MD on 04/07/2009 at 08:38:09

## 2009-06-09 ENCOUNTER — Emergency Department (HOSPITAL_COMMUNITY): Admission: EM | Admit: 2009-06-09 | Discharge: 2009-06-09 | Payer: Self-pay | Admitting: Emergency Medicine

## 2009-06-11 LAB — OFFICE VISIT LAB RESULTS
Bilirubin Urine: NEGATIVE
Blood, UA: NEGATIVE
Glucose, UA: NEGATIVE
Ketones, UA: NEGATIVE
Leukocytes, UA: NEGATIVE
Nitrite, UA: NEGATIVE
Protein, UA: 30
Specific Gravity, UA: 1.025
Urobilinogen, UA: >8
pH, UA: 6.5

## 2009-06-11 NOTE — Unmapped (Signed)
Signed by Eusebio Friendly MD on 06/11/2009 at 15:30:39      Reason for Visit   Chief Complaint: Pain in testicles    History from: patient    Allergies  ! PCN    Medications   ADDERALL XR 15 MG CP24 (AMPHETAMINE-DEXTROAMPHETAMINE) one by mouth daily   ADD (ICD-314.00) (OK fill now)  IBUPROFEN 800 MG TABS (IBUPROFEN) one by mouth every 8 hours as needed  ROBAXIN 500 MG TABS (METHOCARBAMOL) one to two by mouth four times a day as needed spasm  VALTREX 1 GM TABS (VALACYCLOVIR HCL) two by mouth at onset of cold sore then repeat twelve  hours later (4 total)  FLOXIN OTIC 0.3 % SOLN (OFLOXACIN) ten drops to affected ear twice a day  BIAXIN 500 MG TABS (CLARITHROMYCIN) one by mouth twice a day       Vital Signs:   Wt: 185 lbs.      Wt chg (lbs): 5  Temperature: 97.7  degrees F  oral  Pulse: 72    Patient appears to be in acute distress: no  BP: 120/72    Intake recorded by: Eugenie Birks MA on June 11, 2009 3:02 PM    History of Present Illness   bilat testicular pain- had on one side (left) 6 wks ago, lasted couple days  other side (right) discomfort x few days, no lumps- ache to sharp, little worse in some position  no discharge  stool little darker for a few days  bilat lumbar pain off-on  some midepigastric discomfort at times, no heartburn, last week  cold sore right lower lip x 1 wk  some post-void leaking x 6-7 mo, either sitting or standing  good stream, no hesitancy          Physical Examination:   BP: 120/  72    Physical Exam- Detail:   General Appearance: well-developed, well-nourished and in no acute distress.  Abdomen: No masses or tenderness. Bowel sounds active x4 quad.  Liver and spleen are without tenderness or enlargement.  No hernias.      In office Procedures & Tests     Routine Urinalysis     Physical characteristics   Color: yellow  Appearance: clear    Chemical measurements   Glucose (mg/dL): negative  Bilirubin: negative  Ketone (mg/dL): negative  Spec. Gravity: 1.025  Blood: negative  pH:  6.5  Protein (mg/dL): 30  Urobilinogen (mg/dL): >0.9  Nitrite: negative  Leukocytes: negative         New Problems:  TESTICULAR PAIN, RIGHT (ICD-608.9)      Preventive Maintenance               Assessment and Plan   doubt stone or radicular  suspect cyst or congestion  doubt will need urology  ? resolving gastro  suspect benigh post-void dribble    Problems New Problems:  Dx of TESTICULAR PAIN, RIGHT (ICD-608.9)  Onset: 06/11/2009  Today's Orders   81191 - Ofc Vst, Est Level II [YNW-29562]  Urinalysis w/o micro [CPT-81003]  Scrotum US [CPT-76870]  Urology Consult  [UCP-11111]    Patient Instructions   Get ultrasound.  If stools stay dark, call to get stool cards.  May refer to urologist depending upon pain and ultrasound.    Disposition:     *Patient Care Summary printed and given to patient.  as needed

## 2009-08-14 ENCOUNTER — Encounter: Admission: RE | Admit: 2009-08-14 | Discharge: 2009-08-14 | Payer: Self-pay | Admitting: Neurological Surgery

## 2009-10-03 ENCOUNTER — Ambulatory Visit (HOSPITAL_COMMUNITY): Admission: RE | Admit: 2009-10-03 | Discharge: 2009-10-03 | Payer: Self-pay | Admitting: Neurological Surgery

## 2009-12-13 ENCOUNTER — Encounter: Admission: RE | Admit: 2009-12-13 | Discharge: 2009-12-13 | Payer: Self-pay | Admitting: Neurological Surgery

## 2010-03-31 ENCOUNTER — Emergency Department (HOSPITAL_COMMUNITY): Admission: EM | Admit: 2010-03-31 | Discharge: 2010-03-31 | Payer: Self-pay | Admitting: Emergency Medicine

## 2010-08-18 LAB — BASIC METABOLIC PANEL
BUN: 14 mg/dL (ref 6–23)
Creatinine, Ser: 0.84 mg/dL (ref 0.4–1.5)
GFR calc non Af Amer: >60 mL/min (ref >60)
Glucose, Bld: 99 mg/dL (ref 70–99)
Potassium: 4.8 mEq/L (ref 3.5–5.1)

## 2010-08-18 LAB — DIFFERENTIAL
Basophils Absolute: 0.1 10*3/uL (ref 0.0–0.1)
Eosinophils Absolute: 0.1 10*3/uL (ref 0.0–0.7)
Lymphocytes Relative: 26 % (ref 12–46)

## 2010-08-18 LAB — CBC
Hemoglobin: 15.8 g/dL (ref 13.0–17.0)
Platelets: 337 10*3/uL (ref 150–400)

## 2010-08-18 LAB — SURGICAL PCR SCREEN: MRSA, PCR: NEGATIVE

## 2010-09-04 ENCOUNTER — Emergency Department (HOSPITAL_COMMUNITY)
Admission: EM | Admit: 2010-09-04 | Discharge: 2010-09-04 | Disposition: A | Discharge status: Home / Self Care | Payer: Medicare Other | Attending: Emergency Medicine | Admitting: Emergency Medicine

## 2010-09-04 DIAGNOSIS — X500XXA Overexertion from strenuous movement or load, initial encounter: Secondary | ICD-10-CM | POA: Insufficient documentation

## 2010-09-04 DIAGNOSIS — I10 Essential (primary) hypertension: Secondary | ICD-10-CM | POA: Insufficient documentation

## 2010-09-04 DIAGNOSIS — M545 Low back pain, unspecified: Secondary | ICD-10-CM | POA: Insufficient documentation

## 2010-09-04 DIAGNOSIS — G8929 Other chronic pain: Secondary | ICD-10-CM | POA: Insufficient documentation

## 2010-09-07 LAB — URINALYSIS, ROUTINE W REFLEX MICROSCOPIC
Bilirubin Urine: NEGATIVE
Glucose, UA: NEGATIVE mg/dL
Hgb urine dipstick: NEGATIVE
Ketones, ur: NEGATIVE mg/dL
Specific Gravity, Urine: 1.03 (ref 1.005–1.030)
Urobilinogen, UA: 1 mg/dL (ref 0.0–1.0)
pH: 5.5 (ref 5.0–8.0)

## 2010-10-13 NOTE — Assessment & Plan Note (Signed)
[facility]                         GASTROENTEROLOGY OFFICE NOTE   COULSON, WEHNER                        MRN:          [id number]  DATE:05/04/2007                            DOB:          01-13-1971    REASON FOR CONSULTATION:  Rectal bleeding.   Mr. Ardizzone is a 40 year old white male referred for evaluation of above.  He has a history of hemorrhoids and has had recurrent rectal bleeding  consisting of bright red blood per rectum.  He was seen in the ER  several days ago for bowel movements only containing bright red blood.  He has occasional rectal discomfort.  He denies abdominal pain.  There  has been no change in his bowel habits.  He apparently underwent a  colonoscopy for a similar problem 7-8 years ago.   PAST MEDICAL HISTORY:  Back surgery, he is on disability because of  pain.   FAMILY HISTORY:  Noncontributory.   MEDICATIONS:  Percocet and hydrocodone.   HE IS ALLERGIC TO MORPHINE AND PENICILLIN.   He smokes, he drinks on weekends.  He is married and disabled.   REVIEW OF SYSTEMS:  Positive for chronic back pain.   PHYSICAL EXAMINATION:  Pulse 78, blood pressure 120/90, weight 266.  HEENT: EOMI.  PERRLA.  Sclerae are anicteric.  Conjunctivae are pink.  NECK:  Supple without thyromegaly, adenopathy or carotid bruits.  CHEST:  Clear to auscultation and percussion without adventitious  sounds.  CARDIAC:  Regular rhythm; normal S1 S2.  There are no murmurs, gallops  or rubs.  ABDOMEN:  Bowel sounds are normoactive.  Abdomen is soft, nontender and  nondistended.  There are no abdominal masses, tenderness, splenic  enlargement or hepatomegaly.  EXTREMITIES:  Full range of motion.  No cyanosis, clubbing or edema.  RECTAL:  There are no external rectal lesions.   IMPRESSION:  Recurrent hematochezia.  I suspect this is due to  hemorrhoidal bleeding.  A more possible colonic bleeding source ought to  be ruled out.    RECOMMENDATION:  1. Anusol HC suppositories.  2. Colonoscopy.  3. If bleeding persists despite medical therapy I will proceed with      band ligation of his hemorrhoids at the time of his colonoscopy.     Barbette Hair. Arlyce Dice, MD,FACG  Electronically Signed    RDK/MedQ  DD: 05/04/2007  DT: 05/04/2007  Job #: [id number]   cc:   Windle Guard, M.D.

## 2010-10-13 NOTE — H&P (Signed)
NAMERHODES, CALVERT NO.:  [id number]   MEDICAL RECORD NO.:  [id number]           PATIENT TYPE:   LOCATION:                               FACILITY:  [facility]   PHYSICIAN:  Jake Bathe, MD           DATE OF BIRTH:   DATE OF ADMISSION:  01/24/2008  DATE OF DISCHARGE:                              HISTORY & PHYSICAL   PRIMARY CARDIOLOGIST:  Rosine Abe, M.D.   CHIEF COMPLAINT:  Chest pain.   HISTORY OF PRESENT ILLNESS:  A 40 year old male with obesity, chronic  back pain, hypertension, hyperlipidemia with total cholesterol 310,  tobacco use, with wife, who recently underwent bypass times five, who  presents to the emergency department today with constant 8/10 left-sided  chest pain with no radiation, which started last night and has been  constant ever since.  He describes no change with food.  No nausea or  vomiting.  No fevers.  He has not had anything to eat all day.   Yesterday he underwent a stress test, which was abnormal per the  patient's wife.  She describes decreased pumping function, and Dr.  Reyes Ivan asked him to start an aspirin and asked him to be seen on Monday  to start medication.  She mentioned a mass at the front of his heart,  which was not pumping as well, and I am sure that this was  misinterpreted.  He may have a degree of ischemia or may have a  decreased ejection fraction based on these results per patient.  I am  not privy to the results currently.   Currently in the bed he is still complaining of chest discomfort albeit  does not appear in any acute distress.  No associated shortness of  breath, syncope, palpitations.  He does occasionally have diarrhea, but  this is not uncommon for him.  Prior to this, he had occasional sharp  chest pain, lasting a few seconds in duration.  The only new medication  was aspirin, which was started yesterday.   PAST MEDICAL HISTORY:  1. Obesity.  2. Hypertension, on no current medications.  3.  Hyperlipidemia - total cholesterol 310 per the patient.  4. Tobacco abuse.  5. Back pain with prior multiple back surgeries.  6. Chronic pain medication, on Vicodin.  7. Possibly abnormal stress test with decreased ejection fraction as      described per the patient's wife in HPI.   ALLERGIES:  PENICILLIN AND MORPHINE.   MEDICATIONS:  Aspirin and Vicodin.   SOCIAL HISTORY:  Smokes.  He also drinks alcohol, approximately 10 beers  on Saturday.  He also smokes marijuana but denies any cocaine use.  He  is currently on disability.  His daughter is here with him as well as  his wife.   FAMILY HISTORY:  His mother had angina and has COPD.  No early family  history of coronary artery disease.  His father died in a nursing home  at a later age.   REVIEW OF SYSTEMS:  Unless specified above, all other  12 review of  systems negative.   PHYSICAL EXAMINATION:  VITAL SIGNS:  Blood pressure 116/74, pulse 85,  respirations 20, temperature 98.9, satting 96% on room air.  GENERAL:  Alert and oriented x 3, in no acute distress, appears  comfortable in bed, mildly anxious.  HEENT:  Eyes:  Well-perfused conjunctivae.  EOMI.  No scleral icterus.  Moist mucous membranes.  NCAT.  NECK:  Thick.  No carotid bruits.  No JVD.  CARDIOVASCULAR:  Regular rate and rhythm with no appreciable murmurs,  rubs or gallops (he has been told he had a murmur in the past).  Normal-  appearing PMI.  LUNGS:  Clear to auscultation bilaterally without any crackles, wheezes.  Mildly prolonged expiratory phase.  ABDOMEN:  Obese.  Positive bowel sounds.  No hepatosplenomegaly  appreciated.  Nontender.  No bruits.  EXTREMITIES:  No clubbing, cyanosis or edema.  There are 2+ distal  pulses, a 2+ femoral pulse.  No bruits appreciated.  NEUROLOGIC:  Nonfocal.  Normal gait.  No tremors.  PSYCHIATRIC:  Normal mood.  Mildly anxious.  MUSCULOSKELETAL:  Mild tenderness in lower extremities to palpation.  Moves all extremities.   Has limited back motion range.  SKIN:  Clean, dry and intact with new tattoo over left scapula.   DATA:  EKG performed today shows sinus rhythm, rate 79, with no other  ECG changes.  No ST deviation.  When compared to prior ECG from May 20, 2006, there is no significant change.  Chest x-ray personally viewed  shows mild bronchial thickening suggestive of mild reactive airway  disease or bronchitis.  Normal heart size.  D-dimer 0.23, normal.  Cardiac enzymes normal.  BNP less than 30.  Sodium 136, potassium 4.0,  creatinine 0.9, glucose 86.  White count 10.6, hemoglobin 15, hematocrit  46, platelets 312.  Stress test as described above.   ASSESSMENT/PLAN:  A 40 year old male with hypertension, hyperlipidemia,  obesity, tobacco use with chest pain concerning for possible unstable  angina.  1. Possible unstable angina - given recent abnormal nuclear stress      test,  which may be concerning for decreased ejection fraction      and/or ischemia, I will place him on heparin ACS protocol and make      him NPO  past midnight for possible cardiac catheterization.  Of      course, Dr. Reyes Ivan will review nuclear stress test results and make      a prudent decision at that time.  I will place him on low-dose beta      blocker, 25 mg metoprolol twice a day, and make sure he gets an      aspirin.  Nitro paste has been already added to chest wall.  I will      continue to cycle cardiac enzymes q.8 h.  First set of enzymes is      reassuring, especially given the duration of his chest pain since      last night.  Chest x-ray does show some possible bronchitis or      reactive airway disease, and this may be secondary to longstanding      tobacco abuse.  No fevers currently.  No cough.  2. Tobacco abuse - counseled on cessation at length.  No nicotine      patch for now given possible ACS.  3. Obesity - counseled on weight loss.  4. Hypertension - on no medical regimen, currently normotensive.   Will  give metoprolol for possible ACS.  5. Polysubstance use - asked him to curb his alcohol as well as his      marijuana as well as his tobacco use.  6. Chronic back pain - cardiology p.r.n. orders, and we will include      Percocet if needed.  7. Hyperlipidemia - I will go ahead and place him on simvastatin 80 mg      now.  This may be adjusted at a later date.  We will check complete      metabolic profile to ensure liver enzymes are normal.   We will monitor closely overnight on telemetry.      Jake Bathe, MD  Electronically Signed     MCS/MEDQ  D:  01/24/2008  T:  01/24/2008  Job:  [id number]   cc:   Elmore Guise., M.D.

## 2010-10-13 NOTE — Letter (Signed)
May 04, 2007    Arman Bogus   RE:  [name], [name]  MRN:  [id number]  /  DOB:  07-Mar-1971   Dear Chanetta Marshall:   It is my pleasure to have treated you recently as a new patient in my  office.  I appreciate your confidence and the opportunity to participate  in your care.   Since I do have a busy inpatient endoscopy schedule and office schedule,  my office hours vary weekly.  I am, however, available for emergency  calls every day through my office.  If I cannot promptly meet an urgent  office appointment, another one of our gastroenterologists will be able  to assist you.   My well-trained staff are prepared to help you at all times.  For  emergencies after office hours, a physician from our gastroenterology  section is always available through my 24-hour answering service.   While you are under my care, I encourage discussion of your questions  and concerns, and I will be happy to return your calls as soon as I am  available.   Once again, I welcome you as a new patient and I look forward to a happy  and healthy relationship.    Sincerely,      Barbette Hair. Arlyce Dice, MD,FACG  Electronically Signed   RDK/MedQ  DD: 05/04/2007  DT: 05/04/2007  Job #: [id number]

## 2010-10-16 NOTE — Consult Note (Signed)
[name], [name]                 ACCOUNT NO.:  [id number]   MEDICAL RECORD NO.:  [id number]          PATIENT TYPE:  INP   LOCATION:  2899                         FACILITY:  [facility]   PHYSICIAN:  Theodore Demark, P.A. LHCDATE OF BIRTH:  1970/12/19   DATE OF CONSULTATION:  09/30/2004  DATE OF DISCHARGE:                                   CONSULTATION   PRIMARY CARE PHYSICIAN:  Windle Guard, M.D.   PRIMARY CARDIOLOGIST:  New, will be Thomas C. Wall, M.D.   PRIMARY SURGEON:  Tia Alert, MD.   CHIEF COMPLAINT:  Chest pain.   HISTORY OF PRESENT ILLNESS:  [name] is a 40 year old male with no known  history of coronary artery disease.  He came to the hospital today for back  surgery that will involve two different fusions.  While he was in preop, he  had sharp left-sided chest pain.  It reached a 10/10.  He has multiple  cardiac risk factors and cardiac consult was called.   [name] states that he has been having this pain a couple of times a month  for more than 10 years.  He says there is no association with exertion.  There is no association with position or deep inspiration or cough.  The  pain only lasts a few minutes and he has never tried anything to alleviate  it.  It always resolved spontaneously.  It generally starts at rest.  He is  pain free at the time of exam.   PAST MEDICAL HISTORY:  He has been told that he has hypertension and  hyperlipidemia but is not currently being treated for this.  He has family  history of coronary artery disease, ongoing tobacco use and obesity.   PAST SURGICAL HISTORY:  Tonsillectomy and adenoidectomy as well as pins in  his leg.   ALLERGIES:  PENICILLIN AND MORPHINE.   MEDICATIONS:  Percocet p.r.n.   SOCIAL HISTORY:  He lives in [place] with his wife and works as a  Administrator for [employer].  He has two children.  He has a greater than  20 pack year history of tobacco and smokes up to two packs a day.  He drinks  a case of beer about every two weeks and admits to occasional marijuana use  but not recently.   FAMILY HISTORY:  His mother is alive at age 31 with a history of heart  trouble.  His father died in his 47s and the patient has no information.  He denies heart disease in brothers or sisters.   REVIEW OF SYSTEMS:  Significant for back pain.  He has chest pain as  described above.  He denies any reflux symptoms, indigestion, heartburn,  melena,  hematemesis or hemoptysis.  He has no recent illnesses, no fevers  or chills.  Review of systems is otherwise negative.   PHYSICAL EXAMINATION:  VITAL SIGNS:  Temperature is 97.3, blood pressure  128/83, heart rate 94, respiratory rate 20, oxygen saturations 97% on room  air.  GENERAL:  He is a well-developed, obese white male in no acute  distress with  an appropriate affect.  HEENT:  His head is normocephalic and atraumatic.  Pupils equal, round and  reactive to light and accommodation.  Extraocular movements are intact.  Sclerae are clear.  Nose without difficulty.  NECK:  There is no lymphadenopathy, thyromegaly, bruit or JVD noted.  CARDIOVASCULAR:  His heart is regular in rate and rhythm with an S1, S2 and  no significant murmurs, rubs or gallops are noted. His distal pulses are 2+  and no femoral bruits are appreciated.  LUNGS:  Clear to auscultation bilaterally.  SKIN:  No rashes or lesions are noted.  ABDOMEN:  Firm and nontender with active bowel sounds and the liver is  palpated approximately 1 cm below the ribs.  EXTREMITIES:  There is no cyanosis, clubbing or edema.  MUSCULOSKELETAL:  There is no joint deformities or effusions and no spine or  CVA tenderness.  NEUROLOGIC:  He is alert and oriented.  Cranial nerves 2 through 12 are  grossly intact.   EKG:  Sinus rhythm, rate 83 with no acute ischemic changes.   LABORATORY DATA:  Laboratory values are pending.   ASSESSMENT/PLAN:  Chest pain:  The pain is atypical and noncardiac.   His EKG  and physical exam are normal.  It is okay to proceed with surgery as he is  low cardiovascular risk.  He has multiple cardiac risk factors and needs to  quite smoking and make dietary changes as well as drugs and alcohol.  Fasting lipids should be monitored and he should follow up with his primary  care physician.      RB/MEDQ  D:  09/30/2004  T:  09/30/2004  Job:  [id number]   cc:   Windle Guard, M.D.  [address]  [place], [place] [address]  Fax: [phone]

## 2010-10-16 NOTE — Discharge Summary (Signed)
[name], [name]                 ACCOUNT NO.:  [id number]   MEDICAL RECORD NO.:  [id number]          PATIENT TYPE:  INP   LOCATION:  4702                         FACILITY:  [facility]   PHYSICIAN:  Elmore Guise., M.D.DATE OF BIRTH:  06-27-1970   DATE OF ADMISSION:  01/24/2008  DATE OF DISCHARGE:  01/25/2008                               DISCHARGE SUMMARY   DISCHARGE DIAGNOSES:  1. Atypical chest pain.  2. Normal coronary arteries by cardiac catheterization.  3. Gastroesophageal reflux disease.  4. Dyslipidemia.   HISTORY OF PRESENT ILLNESS:  [name] is a 40 year old white male who  presented with constant and ongoing chest discomfort.  He was admitted  for observation.  He continued to have chest pain despite negative  cardiac markers.  He had a history of abnormal recent stress test.  Because of this, he was referred for cardiac catheterization.   HOSPITAL COURSE:  The patient's hospital course was uncomplicated.  He  ruled out for myocardial infarction; however, continued to have chest  pain.  He underwent cardiac catheterization, which showed normal-  appearing coronary arteries with very minimal luminal irregularities.  He had normal LV systolic function.  His post-cath course was  uncomplicated.  He did have elevated cholesterol with a total  cholesterol of 230, triglycerides of 162, LDL of 166, and HDL of 32.  We  discussed the importance of complete tobacco cessation and he  understands the importance of this.  His chest pain was improved on  discharge.   His discharge medications include,  1. Nexium 40 mg daily.  2. Simvastatin 40 mg daily.  3. Aspirin 81 mg daily.  4. Vicodin p.r.n.   His followup appointment will be with Dr. Reyes Ivan who will keep his rate  regular scheduled office visit within 1 week.  Otherwise, he is to call  if he has any swelling or problems at his cath site.      Elmore Guise., M.D.  Electronically Signed     TWK/MEDQ  D:   01/26/2008  T:  01/26/2008  Job:  [id number]

## 2010-10-16 NOTE — Discharge Summary (Signed)
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   PROCEDURE:  Transforaminal lumbar interbody fusion with segmental  instrumentation L3-4, L4-5.   HISTORY OF PRESENT ILLNESS:  Mr. Espin is a 40 year old white male who  presented with back and leg pain.  He had an MRI and a diskogram consistent  with degenerative disk disease and herniated disk at L3-4, L4-5.  I  recommended transforaminal lumbar interbody fusion at L3-L4 and L4-L5.  He  understood the risks, benefits, and expected outcome and wished to proceed.   HOSPITAL COURSE:  The patient was admitted and taken to the operating room  where he underwent a TLIF at L3-L4 and L4-L5.  The patient tolerated the  procedure well and was taken to the recovery room and then to the floor in  stable condition.  For details of the operative procedure please see the  dictated operative note.  The patient's hospital course was routine and  there were no complications.  He spent the first night at bedrest with a  Foley catheter in place.  The following morning, this was removed and he was  able to get up and ambulate without difficulty.  His pain was well  controlled on a PCA for the first night and it was discontinued on the first  postoperative day.  He had good strength in his lower extremities, his  incision remained clean, dry and intact.  He continued to mobilize to the  point where he was discharged home in stable condition on postop day #2.   DISCHARGE MEDICATIONS:  1.  Percocet.  2.  Flexeril.   FINAL DIAGNOSIS:  TLIF at L3-L4 and  L4-L5.       DSJ/MEDQ  D:  12/25/2004  T:  12/25/2004  Job:  [id number]

## 2010-10-16 NOTE — Op Note (Signed)
NAMEWALDON, SHEERIN                 ACCOUNT NO.:  [id number]   MEDICAL RECORD NO.:  [id number]          PATIENT TYPE:  INP   LOCATION:  3008                         FACILITY:  [facility]   PHYSICIAN:  Tia Alert, MD     DATE OF BIRTH:  03-01-1971   DATE OF PROCEDURE:  09/30/2004  DATE OF DISCHARGE:                                 OPERATIVE REPORT   PREOPERATIVE DIAGNOSIS:  Degenerative disease L3-4, L4-5 with lumbar disk  herniation L4-5 to the left with diskogenic back pain and left leg pain.   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   PROCEDURES:  1.  Decompressive hemilaminectomy, hemigastrectomy and foraminotomy L3-4, L4-      5 on the left with near total diskectomy L3-4, L4-5 from a left-sided      approach requiring more bony decompression that is usually needed for      the typical TLIF procedure.  2.  Transforaminal lumbar interbody fusion L3-4, L4-5 utilizing a 11 x 25 mm      peak interbody cage packed with local autograft and Grafton putty.  3.  Posterior lateral arthrodesis L3-4, L4-5 on the right utilizing Grafton      putty and local autograft.  4.  Segmental fixation L3-L5 bilaterally utilizing [employer] pedicle      screw and rod fixation system.   SURGEON:  Dr. Marikay Alar.   ASSISTANT:  Donalee Citrin, M.D.   ANESTHESIA:  General tracheal.   COMPLICATIONS:  None apparent.   INDICATIONS FOR PROCEDURE:  Mr. Medford is a 40 year old white male who was  referred with back pain with some left leg pain. He had an MRI which showed  some spinal stenosis and a disk herniation at L4-5 to the left with  significant degenerative disease. His back pain was worse than  his leg  pain. He underwent diskography which showed concordant back pain, L4-5  greater than L3-4 with a normal response at L5-S1.  Because of poor response  at two disk levels, I recommended a  transforaminal lumbar interbody fusion  at L3-4, L4-5 with segmental instrumentation.  He understood the risks,  benefits and alternatives and wished to proceed.   DESCRIPTION OF PROCEDURE:  The patient was taken to operating room and after  induction of adequate generalized endotracheal anesthesia, he was placed in  the prone position on chest rolls and all pressure points were padded.  Lumbar region was prepped with DuraPrep and draped in the usual sterile  fashion. There was 10 mL of local anesthesia injected and then a dorsal  midline incision was made and carried down to the lumbosacral fascia. The  fascia was opened bilaterally and the paraspinous musculature was taken down  in subperiosteal fashion to expose the L4-L3 for L4-5 interspaces  bilaterally.  Intraoperative fluoroscopy confirmed my level and then a  hemilaminectomy, hemigastrectomy and foraminotomy was performed L3-4, L4-5  on the left side. The L3, L4 and L5 nerve roots were identified and  decompressed  and wide foraminotomies were performed. Once the decompression  was complete, I coagulated the epidural venous vasculature at L3-4 and L4-5  and excised the disk space at L3-4 and L4-5 and performed thorough  intradiskal diskectomies at both levels utilizing scrapers and curettes and  pituitary rongeurs.  A near complete diskectomy was performed at L3-4 and L4-  5.  The interspaces were then measured with distractors and checked under  fluoroscopy.  Each measured to be 11 mm.  We then used two 11 x 25 mm PEEK  body cages, packed these with local autograft mixed with Grafton putty and  then we packed the interspace at L3-4 and L4-5 anteriorly with local  autograft Grafton putty and then tapped the PEEK interbody cages into the  interspace from a transforaminal approach from the left side. We then  checked these under fluoroscopy to confirm adequate placement, then turned  our attention to the segmental fixation with  localized pedicle screw entry  zones at L3, L4 and L5, probed each pedicle, tapped each pedicle with a 6.0  mm tap, probed each pedicle again to make sure there was no break in the  cortex and then placed symptoms 5 x 45 mm pedicle screws into L3, L4 and L5  bilaterally. We then placed two 70 mm rods into the multiaxial screw heads  and locked these into position with the locking caps and antitorque device,  achieving compression on the grafts,  We then irrigated with copious amounts  of bacitracin containing saline solution, decorticated the posterior  elements on the patient's right side and laid a mixture of Grafton putty and  local autograft out over these for a posterior lateral arthrodesis on the  right side and placed a medium Hemovac drain through a separate stab  incision, dried all bleeding points, aligned the dura with Gelfoam and then  closed the fascia with interrupted #1 Vicryl, closed subcutaneous and  subcuticular tissue with 2-0 and 3-0 Vicryl, closed skin with Benzoin and  Steri-Strips. The drapes were removed. Sterile dressing was applied. The  patient was awakened from general anesthesia and transferred to recovery  room in stable condition.  At the end of the procedure all sponge, needle  and instrument counts were correct.      DSJ/MEDQ  D:  09/30/2004  T:  09/30/2004  Job:  [id number]

## 2011-03-08 LAB — DIFFERENTIAL
Basophils Absolute: 0.1
Basophils Relative: 1
Eosinophils Absolute: 0.2
Eosinophils Relative: 2
Lymphocytes Relative: 27
Lymphs Abs: 2.6
Monocytes Absolute: 0.6
Monocytes Relative: 6
Neutro Abs: 6
Neutrophils Relative %: 64

## 2011-03-08 LAB — BASIC METABOLIC PANEL
BUN: 9
CO2: 26
Calcium: 9.4
Chloride: 101
Creatinine, Ser: 0.82
GFR calc Af Amer: >60
GFR calc non Af Amer: >60
Glucose, Bld: 81
Potassium: 4.2
Sodium: 136

## 2011-03-08 LAB — CBC
HCT: 42.2
Hemoglobin: 14.9
MCHC: 35.2
MCV: 92.2
Platelets: 327
RBC: 4.58
RDW: 14.3
WBC: 9.4

## 2011-03-11 ENCOUNTER — Emergency Department (HOSPITAL_COMMUNITY)
Admission: EM | Admit: 2011-03-11 | Discharge: 2011-03-11 | Disposition: A | Discharge status: Home / Self Care | Payer: Medicare Other | Attending: Emergency Medicine | Admitting: Emergency Medicine

## 2011-03-11 DIAGNOSIS — M545 Low back pain, unspecified: Secondary | ICD-10-CM | POA: Insufficient documentation

## 2011-03-11 DIAGNOSIS — I10 Essential (primary) hypertension: Secondary | ICD-10-CM | POA: Insufficient documentation

## 2011-03-16 ENCOUNTER — Encounter: Payer: Self-pay | Admitting: Internal Medicine

## 2011-03-16 DIAGNOSIS — Z0000 Encounter for general adult medical examination without abnormal findings: Secondary | ICD-10-CM | POA: Insufficient documentation

## 2011-03-19 ENCOUNTER — Other Ambulatory Visit (INDEPENDENT_AMBULATORY_CARE_PROVIDER_SITE_OTHER): Payer: Medicare Other

## 2011-03-19 ENCOUNTER — Other Ambulatory Visit: Payer: Self-pay | Admitting: Internal Medicine

## 2011-03-19 ENCOUNTER — Encounter: Payer: Self-pay | Admitting: Internal Medicine

## 2011-03-19 ENCOUNTER — Ambulatory Visit (INDEPENDENT_AMBULATORY_CARE_PROVIDER_SITE_OTHER): Payer: Medicare Other | Admitting: Internal Medicine

## 2011-03-19 VITALS — BP 120/76 | HR 72 | Temp 97.2°F | Ht 69.0 in | Wt 265.0 lb

## 2011-03-19 DIAGNOSIS — I10 Essential (primary) hypertension: Secondary | ICD-10-CM | POA: Insufficient documentation

## 2011-03-19 DIAGNOSIS — M549 Dorsalgia, unspecified: Secondary | ICD-10-CM

## 2011-03-19 DIAGNOSIS — E785 Hyperlipidemia, unspecified: Secondary | ICD-10-CM

## 2011-03-19 DIAGNOSIS — N320 Bladder-neck obstruction: Secondary | ICD-10-CM

## 2011-03-19 DIAGNOSIS — M1990 Unspecified osteoarthritis, unspecified site: Secondary | ICD-10-CM | POA: Insufficient documentation

## 2011-03-19 DIAGNOSIS — R5383 Other fatigue: Secondary | ICD-10-CM

## 2011-03-19 DIAGNOSIS — R5381 Other malaise: Secondary | ICD-10-CM

## 2011-03-19 DIAGNOSIS — M519 Unspecified thoracic, thoracolumbar and lumbosacral intervertebral disc disorder: Secondary | ICD-10-CM | POA: Insufficient documentation

## 2011-03-19 DIAGNOSIS — F17200 Nicotine dependence, unspecified, uncomplicated: Secondary | ICD-10-CM | POA: Insufficient documentation

## 2011-03-19 HISTORY — DX: Unspecified thoracic, thoracolumbar and lumbosacral intervertebral disc disorder: M51.9

## 2011-03-19 LAB — BASIC METABOLIC PANEL
Calcium: 9.5 mg/dL (ref 8.4–10.5)
Creatinine, Ser: 0.8 mg/dL (ref 0.4–1.5)
Glucose, Bld: 96 mg/dL (ref 70–99)

## 2011-03-19 LAB — LIPID PANEL
Cholesterol: 270 mg/dL — ABNORMAL HIGH (ref 0–200)
HDL: 41.9 mg/dL (ref >39.00)
Total CHOL/HDL Ratio: 6
VLDL: 31.6 mg/dL (ref 0.0–40.0)

## 2011-03-19 LAB — URINALYSIS, ROUTINE W REFLEX MICROSCOPIC
Ketones, ur: NEGATIVE
Total Protein, Urine: NEGATIVE
Urine Glucose: NEGATIVE

## 2011-03-19 LAB — TSH: TSH: 1.41 u[IU]/mL (ref 0.35–5.50)

## 2011-03-19 LAB — CBC WITH DIFFERENTIAL/PLATELET
Basophils Relative: 0.5 % (ref 0.0–3.0)
Hemoglobin: 14.5 g/dL (ref 13.0–17.0)
Lymphocytes Relative: 24.6 % (ref 12.0–46.0)
Monocytes Relative: 5.3 % (ref 3.0–12.0)
Neutrophils Relative %: 67.9 % (ref 43.0–77.0)
RDW: 16.3 % — ABNORMAL HIGH (ref 11.5–14.6)

## 2011-03-19 LAB — PSA: PSA: 0.39 ng/mL (ref 0.10–4.00)

## 2011-03-19 LAB — HEPATIC FUNCTION PANEL
ALT: 16 U/L (ref 0–53)
Albumin: 4.3 g/dL (ref 3.5–5.2)

## 2011-03-19 MED ORDER — ATORVASTATIN CALCIUM 20 MG PO TABS: 20.0000 mg | ORAL_TABLET | Freq: Every day | ORAL | Status: DC

## 2011-03-19 MED ORDER — OXYCODONE-ACETAMINOPHEN 10-325 MG PO TABS: 1.0000 | ORAL_TABLET | Freq: Four times a day (QID) | ORAL | Status: DC | PRN

## 2011-03-19 NOTE — Patient Instructions (Addendum)
Take all new medications as prescribed Continue all other medications as before Please have the pharmacy call for refills as you need Please follow lower cholesterol diet Please go to LAB in the Basement for the blood and/or urine tests to be done today Please call the phone number [phone] (the PhoneTree System) for results of testing in 2-3 days;  When calling, simply dial the number, and when prompted enter the MRN number above (the Medical Record Number) and the # key, then the message should start. Please stop smoking Please return in 6 months, or sooner if needed

## 2011-03-19 NOTE — Assessment & Plan Note (Signed)
Also for UA and PSA, as he is due

## 2011-03-19 NOTE — Assessment & Plan Note (Signed)
Etiology unclear, Exam otherwise benign, to check labs as documented, follow with expectant management  

## 2011-03-19 NOTE — Assessment & Plan Note (Signed)
Severe in the past per pt;  For lipitor 20, with labs today, cont lower chol diet

## 2011-03-19 NOTE — Assessment & Plan Note (Signed)
Borderline in the past, with flares > 200 sbp situational in the past; ok to follow

## 2011-03-19 NOTE — Progress Notes (Signed)
  Subjective:    Patient ID: [name], male    DOB: 10-27-1970, 40 y.o.   MRN: [id number]  HPI  Here to establish as new pt; Here to f/u; overall doing ok,  Pt denies chest pain, increased sob or doe, wheezing, orthopnea, PND, increased LE swelling, palpitations, dizziness or syncope.  Pt denies new neurological symptoms such as new headache, or facial or extremity weakness or numbness   Pt denies polydipsia, polyuria, or low sugar symptoms such as weakness or confusion improved with po intake.  Pt states overall good compliance with meds, trying to follow lower cholesterol diet, wt overall stable but little exercise however.  Pt continues to have recurring LBP without change in severity, bowel or bladder change, fever, wt loss,  worsening LE pain/numbness/weakness, gait change or falls, requires chronic pain medication.  Does have sense of ongoing fatigue, but denies signficant hypersomnolence. Past Medical History  Diagnosis Date  . BACK PAIN, CHRONIC 06/17/2007  . 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 HEMORRHOIDS, INTERNAL 06/17/2007  . RECTAL BLEEDING 06/17/2007  . Arthritis   . Hyperlipidemia   . Hypertension   . Lumbar disc disease 03/19/2011   Past Surgical History  Procedure Date  . Back surgery 2006 and 2011    x 2; Dr Leanne Lovely    reports that he has been smoking.  He does not have any smokeless tobacco history on file. He reports that he drinks alcohol. He reports that he does not use illicit drugs. family history includes Arthritis in his mother; Diabetes in his mother; Heart disease in his mother; and Hypertension in his mother. Allergies  Allergen Reactions  . Morphine And Related   . Penicillins   . Tramadol   . Valium    No current outpatient prescriptions on file prior to visit.   Review of Systems Review of Systems  Constitutional: Negative for diaphoresis and unexpected weight change.  HENT: Negative for drooling and tinnitus.   Eyes: Negative for photophobia  and visual disturbance.  Respiratory: Negative for choking and stridor.   Gastrointestinal: Negative for vomiting and blood in stool.  Genitourinary: Negative for hematuria and decreased urine volume.    Objective:   Physical Exam BP 120/76  Pulse 72  Temp(Src) 97.2 F (36.2 C) (Oral)  Ht 5\' 9"  (1.753 m)  Wt 265 lb (120.203 kg)  BMI 39.13 kg/m2  SpO2 96% Physical Exam  VS noted Constitutional: Pt appears well-developed and well-nourished.  HENT: Head: Normocephalic.  Right Ear: External ear normal.  Left Ear: External ear normal.  Eyes: Conjunctivae and EOM are normal. Pupils are equal, round, and reactive to light.  Neck: Normal range of motion. Neck supple.  Cardiovascular: Normal rate and regular rhythm.   Pulmonary/Chest: Effort normal and breath sounds normal.  Abd:  Soft, NT, non-distended, + BS Neurological: Pt is alert. No cranial nerve deficit. motor/dtr/gait intact but marked diffuse chronic lumbar tenderness, and 3+/5 LLE distal weakness Skin: Skin is warm. No erythema.  Psychiatric: Pt behavior is normal. Thought content normal.     Assessment & Plan:

## 2011-03-20 ENCOUNTER — Encounter: Payer: Self-pay | Admitting: Internal Medicine

## 2011-03-20 NOTE — Assessment & Plan Note (Signed)
stable overall by hx and exam, and pt to continue medical treatment as before 

## 2011-03-22 ENCOUNTER — Telehealth: Payer: Self-pay | Admitting: Internal Medicine

## 2011-03-22 NOTE — Telephone Encounter (Signed)
Message copied by Corwin Levins on Mon Mar 22, 2011 11:31 AM ------      Message from: Pincus Sanes      Created: Mon Mar 22, 2011  8:04 AM       Called the patient informed of lab results, he agreed to start lipitor

## 2011-03-22 NOTE — Telephone Encounter (Signed)
Medication rx was sent to pharmacy  Please ask pt to make ROV in 4 wks to re-check labs

## 2011-03-22 NOTE — Telephone Encounter (Signed)
Patient informed  And agreed to schedule ROV in 4 weeks.

## 2011-04-19 ENCOUNTER — Ambulatory Visit (INDEPENDENT_AMBULATORY_CARE_PROVIDER_SITE_OTHER): Payer: Medicare Other | Admitting: Internal Medicine

## 2011-04-19 ENCOUNTER — Encounter: Payer: Self-pay | Admitting: Internal Medicine

## 2011-04-19 ENCOUNTER — Other Ambulatory Visit (INDEPENDENT_AMBULATORY_CARE_PROVIDER_SITE_OTHER): Payer: Medicare Other

## 2011-04-19 VITALS — BP 120/70 | HR 88 | Temp 98.3°F | Ht 69.0 in | Wt 269.0 lb

## 2011-04-19 DIAGNOSIS — E785 Hyperlipidemia, unspecified: Secondary | ICD-10-CM

## 2011-04-19 DIAGNOSIS — Z79899 Other long term (current) drug therapy: Secondary | ICD-10-CM

## 2011-04-19 DIAGNOSIS — F32A Depression, unspecified: Secondary | ICD-10-CM | POA: Insufficient documentation

## 2011-04-19 DIAGNOSIS — G4700 Insomnia, unspecified: Secondary | ICD-10-CM | POA: Insufficient documentation

## 2011-04-19 DIAGNOSIS — F329 Major depressive disorder, single episode, unspecified: Secondary | ICD-10-CM | POA: Insufficient documentation

## 2011-04-19 HISTORY — DX: Depression, unspecified: F32.A

## 2011-04-19 LAB — LIPID PANEL
HDL: 39.9 mg/dL (ref >39.00)
LDL Cholesterol: 107 mg/dL — ABNORMAL HIGH (ref 0–99)
Triglycerides: 139 mg/dL (ref 0.0–149.0)

## 2011-04-19 LAB — HEPATIC FUNCTION PANEL
Albumin: 4.1 g/dL (ref 3.5–5.2)
Total Protein: 7.6 g/dL (ref 6.0–8.3)

## 2011-04-19 MED ORDER — ZOLPIDEM TARTRATE 10 MG PO TABS: 10.0000 mg | ORAL_TABLET | Freq: Every evening | ORAL | Status: AC | PRN

## 2011-04-19 NOTE — Patient Instructions (Addendum)
Take all new medications as prescribed - the generic for ambien for sleep Continue all other medications as before Please go to LAB in the Basement for the blood and/or urine tests to be done today Please call the phone number [phone] (the PhoneTree System) for results of testing in 2-3 days;  When calling, simply dial the number, and when prompted enter the MRN number above (the Medical Record Number) and the # key, then the message should start.

## 2011-04-25 ENCOUNTER — Encounter: Payer: Self-pay | Admitting: Internal Medicine

## 2011-04-25 NOTE — Assessment & Plan Note (Addendum)
stable overall by hx and exam, most recent data reviewed with pt, and pt to continue medical treatment as before, declines ssri  Lab Results  Component Value Date   WBC 9.6 03/19/2011   HGB 14.5 03/19/2011   HCT 43.0 03/19/2011   PLT 316.0 03/19/2011   GLUCOSE 96 03/19/2011   CHOL 175 04/19/2011   TRIG 139.0 04/19/2011   HDL 39.90 04/19/2011   LDLDIRECT 207.4 03/19/2011   LDLCALC 107* 04/19/2011   ALT 16 04/19/2011   AST 16 04/19/2011   NA 139 03/19/2011   K 5.4* 03/19/2011   CL 101 03/19/2011   CREATININE 0.8 03/19/2011   BUN 8 03/19/2011   CO2 29 03/19/2011   TSH 1.41 03/19/2011   PSA 0.39 03/19/2011   INR 0.98 09/29/2009

## 2011-04-25 NOTE — Assessment & Plan Note (Signed)
Worsening overall, ok for ambien prn,  to f/u any worsening symptoms or concerns

## 2011-04-25 NOTE — Progress Notes (Signed)
  Subjective:    Patient ID: [name], male    DOB: 10/07/1970, 40 y.o.   MRN: [id number]  HPI  Here with c/o worsening recent insomnia for  2 wks, with marked difficulty getting to sleep nightly,  Appears likely related to recent social stressors including a daughter who just moved out to live with her boyfriend, also pregnant.  Denies worsening depressive symptoms, suicidal ideation, or panic, though has ongoing anxiety, as above.  Pt denies chest pain, increased sob or doe, wheezing, orthopnea, PND, increased LE swelling, palpitations, dizziness or syncope.  Pt denies new neurological symptoms such as new headache, or facial or extremity weakness or numbness.   Pt denies polydipsia, polyuria.  Still with lipitor , tolerating well, and trying to follow lower chol diet. Due for lab f/u. Past Medical History  Diagnosis Date  . BACK PAIN, CHRONIC 06/17/2007  . 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 HEMORRHOIDS, INTERNAL 06/17/2007  . RECTAL BLEEDING 06/17/2007  . Arthritis   . Hyperlipidemia   . Hypertension   . Lumbar disc disease 03/19/2011  . Depression 04/19/2011   Past Surgical History  Procedure Date  . Back surgery 2006 and 2011    x 2; Dr Leanne Lovely    reports that he has been smoking.  He does not have any smokeless tobacco history on file. He reports that he drinks alcohol. He reports that he does not use illicit drugs. family history includes Arthritis in his mother; Diabetes in his mother; Heart disease in his mother; and Hypertension in his mother. Allergies  Allergen Reactions  . Morphine And Related   . Penicillins   . Tramadol   . Valium    Current Outpatient Prescriptions on File Prior to Visit  Medication Sig Dispense Refill  . atorvastatin (LIPITOR) 20 MG tablet Take 1 tablet (20 mg total) by mouth daily.  90 tablet  3  . oxyCODONE-acetaminophen (PERCOCET) 10-325 MG per tablet Take 1-2 tablets by mouth every 6 (six) hours as needed. To fill May 18, 2011  120  tablet  0   Review of Systems Review of Systems  Constitutional: Negative for diaphoresis and unexpected weight change.  HENT: Negative for drooling and tinnitus.   Eyes: Negative for photophobia and visual disturbance.  Respiratory: Negative for choking and stridor.   Gastrointestinal: Negative for vomiting and blood in stool.  Genitourinary: Negative for hematuria and decreased urine volume.     Objective:   Physical Exam BP 120/70  Pulse 88  Temp(Src) 98.3 F (36.8 C) (Oral)  Ht 5\' 9"  (1.753 m)  Wt 269 lb (122.018 kg)  BMI 39.72 kg/m2  SpO2 95% Physical Exam  VS noted Constitutional: Pt appears well-developed and well-nourished.  HENT: Head: Normocephalic.  Right Ear: External ear normal.  Left Ear: External ear normal.  Eyes: Conjunctivae and EOM are normal. Pupils are equal, round, and reactive to light.  Neck: Normal range of motion. Neck supple.  Cardiovascular: Normal rate and regular rhythm.   Pulmonary/Chest: Effort normal and breath sounds normal.  Neurological: Pt is alert. No cranial nerve deficit.  Skin: Skin is warm. No erythema.  Psychiatric: Pt behavior is normal. Thought content normal. 1-2+ nervous, not depressed appearing    Assessment & Plan:

## 2011-04-25 NOTE — Assessment & Plan Note (Signed)
stable overall by hx and exam, most recent data reviewed with pt, and pt to continue medical treatment as before, on new statin, for f/u labs today

## 2011-04-26 ENCOUNTER — Telehealth: Payer: Self-pay

## 2011-04-26 NOTE — Telephone Encounter (Signed)
Only applies to a few specific lots of generic lipitor (the concern was about bits of glass), and he can check with his pharmacy, but he likely would have been notified by now if his med was part of that batch of lots;  Ok to [employer] as is

## 2011-04-26 NOTE — Telephone Encounter (Signed)
Patient informed of MD's instructions

## 2011-04-26 NOTE — Telephone Encounter (Signed)
The patient has heard the recall on Lipitor, please advise as would like explanation. Call back number is [phone]

## 2011-05-03 ENCOUNTER — Emergency Department (HOSPITAL_COMMUNITY)
Admission: EM | Admit: 2011-05-03 | Discharge: 2011-05-03 | Discharge status: Against Medical Advice | Payer: Medicare Other

## 2011-05-04 ENCOUNTER — Other Ambulatory Visit (INDEPENDENT_AMBULATORY_CARE_PROVIDER_SITE_OTHER): Payer: Medicare Other

## 2011-05-04 ENCOUNTER — Encounter: Payer: Self-pay | Admitting: Internal Medicine

## 2011-05-04 ENCOUNTER — Ambulatory Visit (INDEPENDENT_AMBULATORY_CARE_PROVIDER_SITE_OTHER)
Admission: RE | Admit: 2011-05-04 | Discharge: 2011-05-04 | Disposition: A | Discharge status: Home / Self Care | Payer: Medicare Other | Source: Ambulatory Visit | Attending: Internal Medicine | Admitting: Internal Medicine

## 2011-05-04 ENCOUNTER — Telehealth: Payer: Self-pay

## 2011-05-04 ENCOUNTER — Ambulatory Visit (INDEPENDENT_AMBULATORY_CARE_PROVIDER_SITE_OTHER): Payer: Medicare Other | Admitting: Internal Medicine

## 2011-05-04 VITALS — BP 102/62 | HR 91 | Temp 98.6°F | Ht 69.0 in | Wt 256.2 lb

## 2011-05-04 DIAGNOSIS — I10 Essential (primary) hypertension: Secondary | ICD-10-CM

## 2011-05-04 DIAGNOSIS — K625 Hemorrhage of anus and rectum: Secondary | ICD-10-CM

## 2011-05-04 DIAGNOSIS — R109 Unspecified abdominal pain: Secondary | ICD-10-CM

## 2011-05-04 LAB — BASIC METABOLIC PANEL
BUN: 14 mg/dL (ref 6–23)
CO2: 25 mEq/L (ref 19–32)
Calcium: 9.3 mg/dL (ref 8.4–10.5)
Chloride: 103 mEq/L (ref 96–112)
Creatinine, Ser: 1 mg/dL (ref 0.4–1.5)
Glucose, Bld: 94 mg/dL (ref 70–99)
Potassium: 4.4 mEq/L (ref 3.5–5.1)

## 2011-05-04 LAB — CBC WITH DIFFERENTIAL/PLATELET
Basophils Relative: 0.4 % (ref 0.0–3.0)
Eosinophils Relative: 1.3 % (ref 0.0–5.0)
Lymphocytes Relative: 17.4 % (ref 12.0–46.0)
MCV: 90.9 fl (ref 78.0–100.0)
Monocytes Absolute: 0.6 10*3/uL (ref 0.1–1.0)
Monocytes Relative: 5.4 % (ref 3.0–12.0)
Neutrophils Relative %: 75.5 % (ref 43.0–77.0)
Platelets: 425 10*3/uL — ABNORMAL HIGH (ref 150.0–400.0)
RBC: 4.53 Mil/uL (ref 4.22–5.81)
WBC: 11.9 10*3/uL — ABNORMAL HIGH (ref 4.5–10.5)

## 2011-05-04 LAB — HEPATIC FUNCTION PANEL
ALT: 12 U/L (ref 0–53)
AST: 18 U/L (ref 0–37)
Albumin: 4.2 g/dL (ref 3.5–5.2)
Total Protein: 7.9 g/dL (ref 6.0–8.3)

## 2011-05-04 LAB — H. PYLORI ANTIBODY, IGG: H Pylori IgG: POSITIVE

## 2011-05-04 LAB — LIPASE: Lipase: 21 U/L (ref 11.0–59.0)

## 2011-05-04 NOTE — Patient Instructions (Signed)
Continue all other medications as before Please go to LAB in the Basement for the blood and/or urine tests to be done today Please go to XRAY in the Basement for the x-ray test Please call the phone number [phone] (the [employer] System) for results of testing in 2-3 days;  When calling, simply dial the number, and when prompted enter the MRN number above (the Medical Record Number) and the # key, then the message should start. You have an Appointment next with Dr Arlyce Dice at 145 on Thursday Dec 6

## 2011-05-04 NOTE — Progress Notes (Signed)
Subjective:    Patient ID: [name], male    DOB: November 18, 1970, 40 y.o.   MRN: [id number]  HPI  Here to f/u;  No records avail today, but pt here with wife, state seen in [facility] ER on sat dec 1 with abd pain;  Had blood/urine/abd ultrasound/ct abd for eval of then 2 days left sided abd pain with some nausea and early satiety, but no vomiting, other bowel change or blood at that time.  Had fever, and found to have WBC 18K per wife, and though CTuultrasound neg was tx with empiric cipro/flagyl and asked to f/u here, Pt states essentially no change in symptoms , pain still at least 5/10, and fever persists.  Also with trace BRBPR to tissue paper twice since last PM.   . Pt denies chest pain, increased sob or doe, wheezing, orthopnea, PND, increased LE swelling, palpitations, dizziness or syncope.   Pt denies polydipsia, polyuria.   Past Medical History  Diagnosis Date  . BACK PAIN, CHRONIC 06/17/2007  . 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 HEMORRHOIDS, INTERNAL 06/17/2007  . RECTAL BLEEDING 06/17/2007  . Arthritis   . Hyperlipidemia   . Hypertension   . Lumbar disc disease 03/19/2011  . Depression 04/19/2011   Past Surgical History  Procedure Date  . Back surgery 2006 and 2011    x 2; Dr Leanne Lovely    reports that he has been smoking.  He does not have any smokeless tobacco history on file. He reports that he drinks alcohol. He reports that he does not use illicit drugs. family history includes Arthritis in his mother; Diabetes in his mother; Heart disease in his mother; and Hypertension in his mother. Allergies  Allergen Reactions  . Morphine And Related   . Penicillins   . Tramadol   . Valium    Current Outpatient Prescriptions on File Prior to Visit  Medication Sig Dispense Refill  . atorvastatin (LIPITOR) 20 MG tablet Take 1 tablet (20 mg total) by mouth daily.  90 tablet  3  . oxyCODONE-acetaminophen (PERCOCET) 10-325 MG per tablet Take 1-2 tablets by mouth every 6 (six)  hours as needed. To fill May 18, 2011  120 tablet  0  . zolpidem (AMBIEN) 10 MG tablet Take 1 tablet (10 mg total) by mouth at bedtime as needed for sleep.  30 tablet  3   Review of Systems Review of Systems  Constitutional: Negative for diaphoresis and unexpected weight change.  HENT: Negative for drooling and tinnitus.   Eyes: Negative for photophobia and visual disturbance.  Respiratory: Negative for choking and stridor.   Gastrointestinal: Negative for vomiting and blood in stool.  Genitourinary: Negative for hematuria and decreased urine volume.     Objective:   Physical Exam BP 102/62  Pulse 91  Temp(Src) 98.6 F (37 C) (Oral)  Ht 5\' 9"  (1.753 m)  Wt 256 lb 4 oz (116.234 kg)  BMI 37.84 kg/m2  SpO2 97% Physical Exam  VS noted Constitutional: Pt appears well-developed and well-nourished.  HENT: Head: Normocephalic.  Right Ear: External ear normal.  Left Ear: External ear normal.  Eyes: Conjunctivae and EOM are normal. Pupils are equal, round, and reactive to light.  Neck: Normal range of motion. Neck supple.  Cardiovascular: Normal rate and regular rhythm.   Pulmonary/Chest: Effort normal and breath sounds normal.  Abd:  Soft, non-distended, + BS with diffuse tenderness, worse to LUQ/LLQ without guarding or rebound Skin: Skin is warm. No erythema. No rash Psychiatric: Pt  behavior is normal. Thought content normal. 1+nervous    Assessment & Plan:

## 2011-05-04 NOTE — Assessment & Plan Note (Signed)
Minor, likely rectal source, for cbc.,  to f/u any worsening symptoms or concerns

## 2011-05-04 NOTE — Assessment & Plan Note (Signed)
Etiology unclear, for now to cont pt chronic pain med, current antibx, and will re-draw labs, and abd films to furhter assess for obstruction or constipation or other;  Hold on other repeat imaging at this time. D/w Dr Kaplan/GI - agrees to see Thur dec 6 at 145 pm

## 2011-05-04 NOTE — Assessment & Plan Note (Signed)
stable overall by hx and exam, most recent data reviewed with pt, and pt to continue medical treatment as before  BP Readings from Last 3 Encounters:  05/04/11 102/62  04/19/11 120/70  03/19/11 120/76

## 2011-05-04 NOTE — Telephone Encounter (Signed)
[facility] Triage Call Report Triage Record Num: [id number] Operator: Reino Bellis Patient Name: [name] Call Date & Time: 05/03/2011 8:32:03PM Patient Phone: [phone] PCP: Oliver Barre Patient Gender: Male PCP Fax : [phone] Patient DOB: 08-01-1970 Practice Name: [facility] Reason for Call: Caller: Jonatan/Patient; PCP: Oliver Barre; CB#: [phone]; Call Reason: Rectal Pain; Sx Onset: 05/01/2011; Sx Notes: ; Afebrile; Wt: ; Guideline Used: ; Disp:; Appt Scheduled?: Pt. calling about rectal pain. Went to ED on 05/01/11 and was told he was constipated and "infection in stomach." Pt. was started on antibiotics which he currently taking. Pt. c/o rectal bleeding all day on 05/03/11 & pain. Onset: 05/02/11. Per Gastrointestinal BLeeding Protocol - one or more episodes of rectal bleeding and no symptoms of hypovolemia. Pt. advised to go to ED [facility]) for evaluation and verbalizes understanding. Protocol(s) Used: Gastrointestinal Bleeding Recommended Outcome per Protocol: See Provider within 4 hours Reason for Outcome: One or more episodes of rectal bleeding (more than scant) and no symptoms of hypovolemia Care Advice: Call EMS 911 if signs and symptoms of shock develop (such as unable to stand due to faintness, dizziness, or lightheadedness; new onset of confusion; slow to respond or difficult to awaken; skin is pale, gray, cool, or moist to touch; severe weakness; loss of consciousness). ~ ~ DO NOT drive until consulting with provider. ~ IMMEDIATE ACTION ~ List, or take, all current prescription(s), nonprescription or alternative medication(s) to provider for evaluation. May have clear liquids (such as water, clear fruit juices without pulp, soda, tea or coffee without dairy or non-dairy creamer, clear broth or bouillon, oral hydration solution, or plain gelatin, fruit ices/popsicles, hard candy) but do not eat solid foods before being seen by your provider. ~ 05/03/2011  8:43:06PM Page 1 of 1 [id number]

## 2011-05-05 ENCOUNTER — Other Ambulatory Visit: Payer: Self-pay | Admitting: Internal Medicine

## 2011-05-05 ENCOUNTER — Other Ambulatory Visit: Payer: Medicare Other

## 2011-05-05 LAB — URINALYSIS, ROUTINE W REFLEX MICROSCOPIC
Ketones, ur: NEGATIVE
Leukocytes, UA: NEGATIVE
Nitrite: NEGATIVE
Specific Gravity, Urine: >=1.03 (ref 1.000–1.030)
Urobilinogen, UA: 0.2 (ref 0.0–1.0)

## 2011-05-06 ENCOUNTER — Encounter: Payer: Self-pay | Admitting: Gastroenterology

## 2011-05-06 ENCOUNTER — Ambulatory Visit (INDEPENDENT_AMBULATORY_CARE_PROVIDER_SITE_OTHER): Payer: Medicare Other | Admitting: Gastroenterology

## 2011-05-06 DIAGNOSIS — D126 Benign neoplasm of colon, unspecified: Secondary | ICD-10-CM

## 2011-05-06 DIAGNOSIS — R109 Unspecified abdominal pain: Secondary | ICD-10-CM

## 2011-05-06 LAB — CLOSTRIDIUM DIFFICILE EIA
CDIFTX: NEGATIVE
Previous Result: POSITIVE

## 2011-05-06 NOTE — Progress Notes (Signed)
History of Present Illness: [name] is a 40 year old white male referred at the request of Dr. Jonny Ruiz for evaluation of abdominal pain. Approximately a week ago he was seen at [facility] ER because of left lower quadrant pain. He apparently did not have a bowel movement for over a week. He normally moves his bowels several times a day. He specifically describes pain when sitting in his lower abdomen. He apparently underwent a CT scan that was unremarkable and was placed on Cipro and Flagyl. The following day he started to move his bowels and he has done so throughout the week. Initially he had some leakage of stool. He's moving his bowels several times a day which is his norm. He has seen small amounts of blood on the toilet tissue. Abdominal pain is significantly improved although he still has some tenderness in his left upper quadrant. The patient has a history of adenomatous polyps and hemorrhoids seen at colonoscopy in 2008. He is without fever or chills.    Past Medical History  Diagnosis Date  . BACK PAIN, CHRONIC 06/17/2007  . 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 HEMORRHOIDS, INTERNAL 06/17/2007  . RECTAL BLEEDING 06/17/2007  . Arthritis   . Hyperlipidemia   . Hypertension   . Lumbar disc disease 03/19/2011  . Depression 04/19/2011   Past Surgical History  Procedure Date  . Back surgery 2006 and 2011    x 2; Dr Leanne Lovely   family history includes Arthritis in his mother; Diabetes in his mother; Heart disease in his mother; and Hypertension in his mother.  There is no history of Colon cancer. Current Outpatient Prescriptions  Medication Sig Dispense Refill  . atorvastatin (LIPITOR) 20 MG tablet Take 1 tablet (20 mg total) by mouth daily.  90 tablet  3  . ciprofloxacin (CIPRO) 500 MG tablet Take 500 mg by mouth 2 (two) times daily. For 7 days       . metroNIDAZOLE (FLAGYL) 500 MG tablet Take 500 mg by mouth 2 (two) times daily. For 7 days       . oxyCODONE-acetaminophen (PERCOCET)  10-325 MG per tablet Take 1-2 tablets by mouth every 6 (six) hours as needed. To fill May 18, 2011  120 tablet  0  . zolpidem (AMBIEN) 10 MG tablet Take 1 tablet (10 mg total) by mouth at bedtime as needed for sleep.  30 tablet  3   Allergies as of 05/06/2011 - Review Complete 05/06/2011  Allergen Reaction Noted  . Morphine and related  03/19/2011  . Penicillins  03/19/2011  . Tramadol  03/19/2011  . Valium  03/19/2011    reports that he has been smoking.  He has never used smokeless tobacco. He reports that he drinks alcohol. He reports that he does not use illicit drugs.     Review of Systems: Pertinent positive and negative review of systems were noted in the above HPI section. All other review of systems were otherwise negative.  Vital signs were reviewed in today's medical record Physical Exam: General: Well developed , well nourished, no acute distress Head: Normocephalic and atraumatic Eyes:  sclerae anicteric, EOMI Ears: Normal auditory acuity Mouth: No deformity or lesions Neck: Supple, no masses or thyromegaly Lungs: Clear throughout to auscultation Heart: Regular rate and rhythm; no murmurs, rubs or bruits Abdomen: Soft and non distended. No masses, hepatosplenomegaly or hernias noted. Normal Bowel sounds; there is mild tenderness in the left upper quadrant and left periumbilical area without guarding or rebound Rectal:deferred Musculoskeletal: Symmetrical with  no gross deformities  Skin: No lesions on visible extremities Pulses:  Normal pulses noted Extremities: No clubbing, cyanosis, edema or deformities noted Neurological: Alert oriented x 4, grossly nonfocal Cervical Nodes:  No significant cervical adenopathy Inguinal Nodes: No significant inguinal adenopathy Psychological:  Alert and cooperative. Normal mood and affect

## 2011-05-06 NOTE — Patient Instructions (Addendum)
You have been given a separate informational sheet regarding your tobacco use, the importance of quitting and local resources to help you quit. You will need to schedule a colonoscopy at the end of January for a February appointment  You will be scheduled for a Previsit with a nurse at that time to sign paperwork and get all instructions Try Miralax if no bowel movement every 3-4 days

## 2011-05-06 NOTE — Assessment & Plan Note (Signed)
Plan followup colonoscopy 

## 2011-05-06 NOTE — Assessment & Plan Note (Signed)
Abdominal pain was likely due to constipation and obstipation. There was no CT evidence, by history, for acute diverticulitis.  Recommendations #1 discontinue antibiotics #2 followup colonoscopy #3 daily fiber supplementation #4 the patient was instructed to take MiraLax if he does not have a bowel movement after 3-4 days

## 2011-05-07 ENCOUNTER — Encounter: Payer: Self-pay | Admitting: Gastroenterology

## 2011-05-09 IMAGING — RF DG MYELOGRAM LUMBAR
13 of 14 series · 13 of 14 positions shown · IV contrast (omnipaque)
Comparison: Lumbar radiographs 06/09/2009.  MRI of the lumbar spine
06/09/2006.

CLINICAL DATA: Persistent low back and left lower extremity pain.

MYELOGRAM INJECTION
TECHNIQUE: Informed consent was obtained from the patient prior to
the procedure, including potential complications of headache,
allergy, infection and pain.  A timeout procedure was performed.
With the patient prone, the lower back was prepped with Betadine.
1% Lidocaine was used for local anesthesia.  Lumbar puncture was
performed at the right paramidline L1-2 level using a 22 gauge
needle with return of clear CSF.  15 ml of Omnipaque 052was
injected into the subarachnoid space .
TECHNIQUE: Following injection of intrathecal Omnipaque contrast,
spine imaging in multiple projections was performed using
fluoroscopy.
Fluoroscopy Time: 55 seconds minutes.
TECHNIQUE: CT imaging of the lumbar spine was performed after
intrathecal contrast administration.  Multiplanar CT image
reconstructions were also generated.

[Series 1: (hospital) · 1 of 1 slices shown]
[im 1/1]
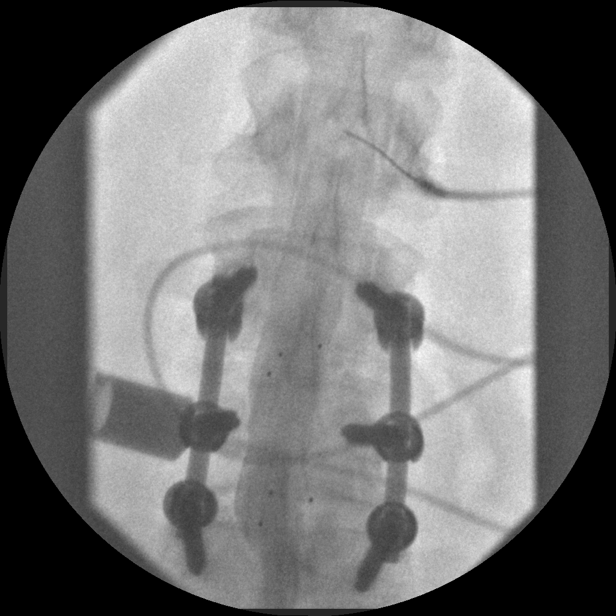

[Series 2: myelogram  white · 1 of 1 slices shown (1 of 12)]
[im 1/1]
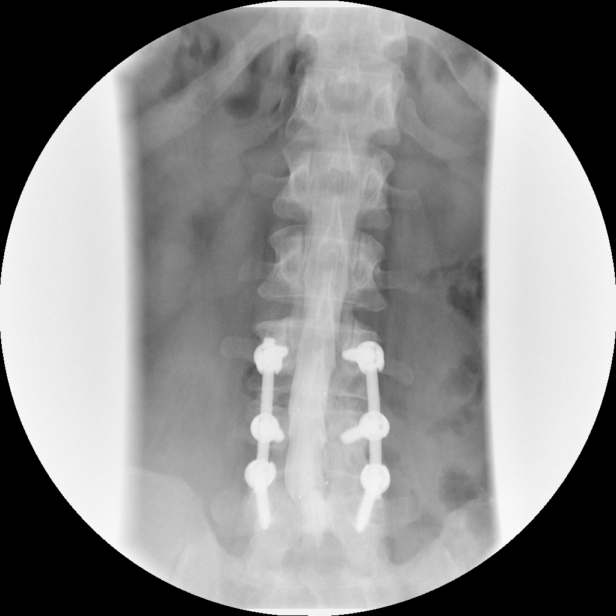

[Series 3: myelogram  white · 1 of 1 slices shown (2 of 12)]
[im 1/1]
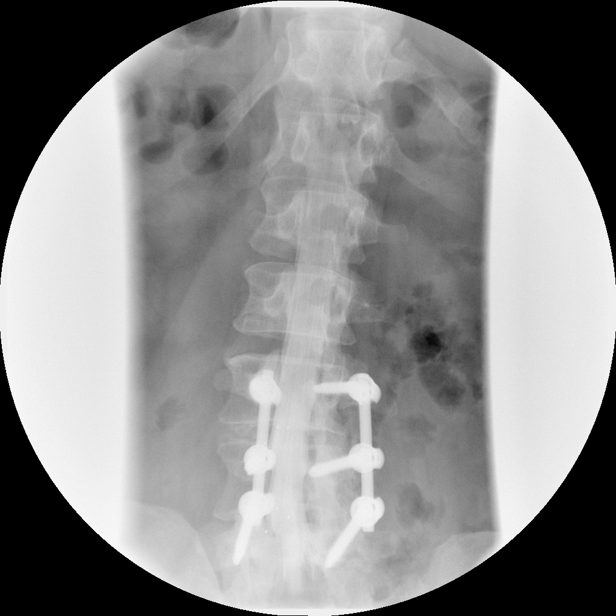

[Series 4: myelogram  white · 1 of 1 slices shown (3 of 12)]
[im 1/1]
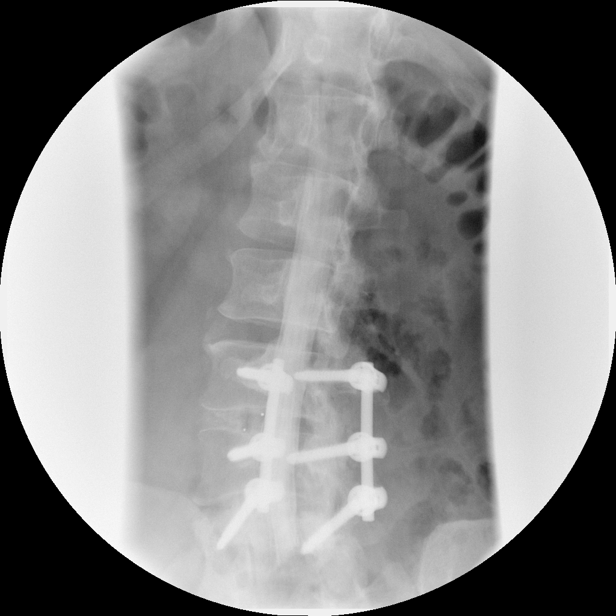

[Series 5: myelogram  white · 1 of 1 slices shown (4 of 12)]
[im 1/1]
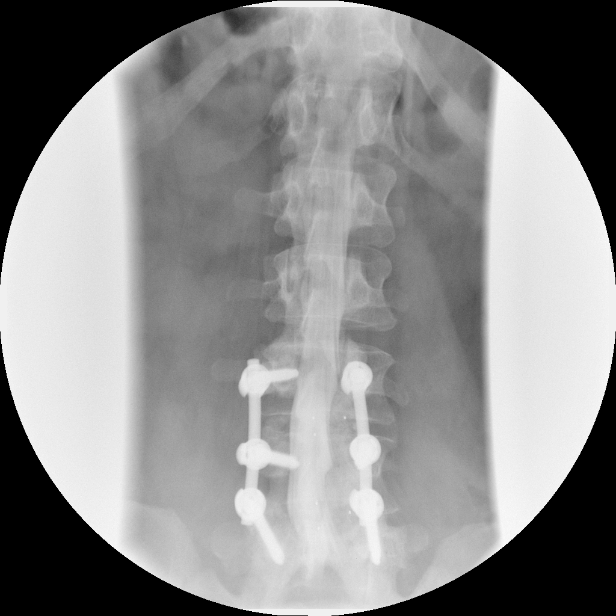

[Series 6: myelogram  white · 1 of 1 slices shown (5 of 12)]
[im 1/1]
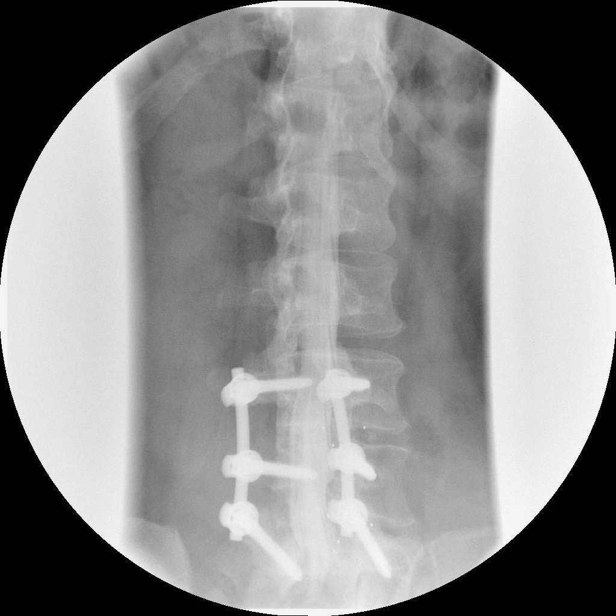

[Series 8: myelogram  white · 1 of 1 slices shown (6 of 12)]
[im 1/1]
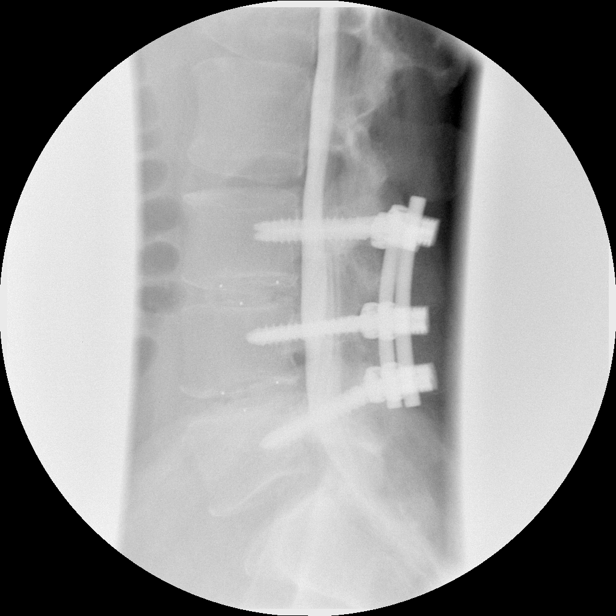

[Series 10: myelogram  white · 1 of 1 slices shown (7 of 12)]
[im 1/1]
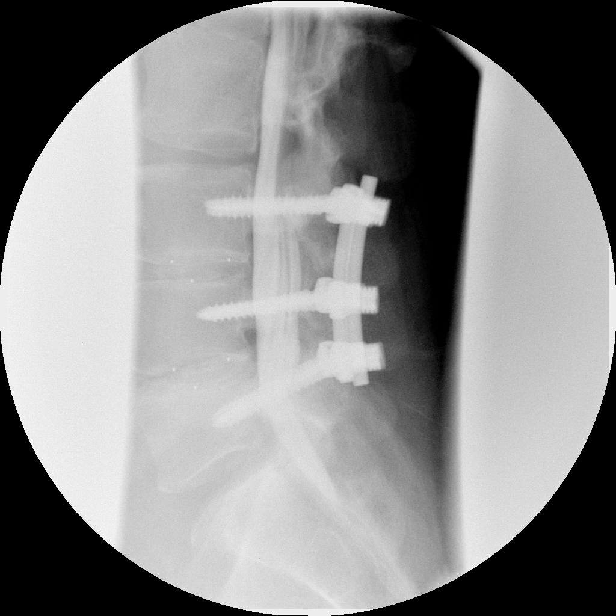

[Series 11: myelogram  white · 1 of 1 slices shown (8 of 12)]
[im 1/1]
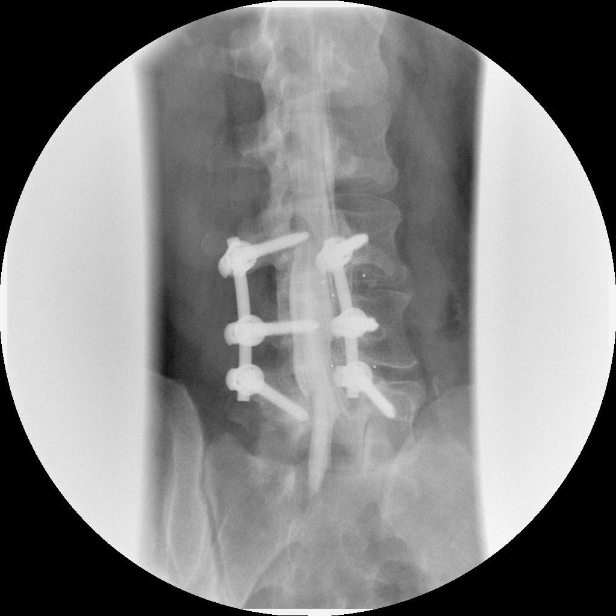

[Series 12: myelogram  white · 1 of 1 slices shown (9 of 12)]
[im 1/1]
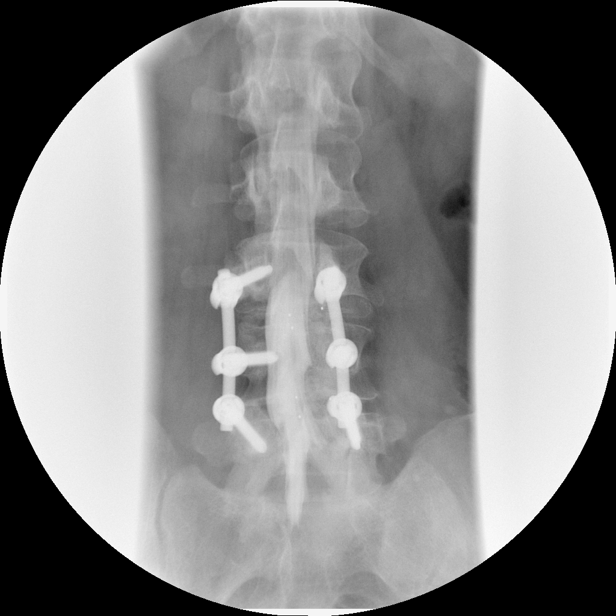

[Series 13: myelogram  white · 1 of 1 slices shown (10 of 12)]
[im 1/1]
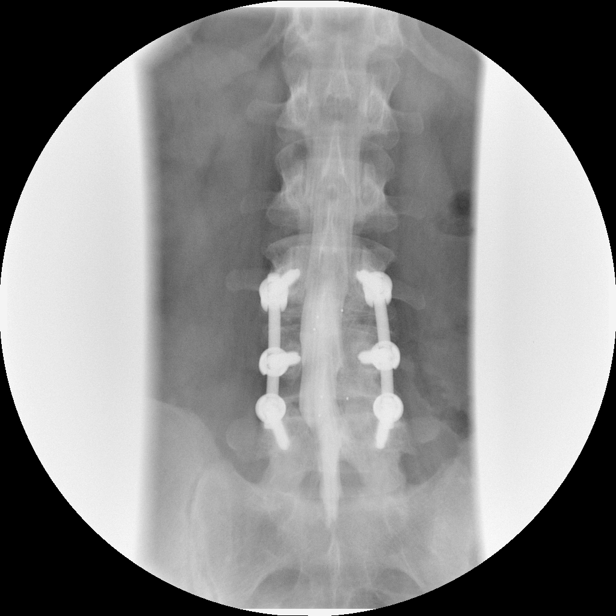

[Series 14: myelogram  white · 1 of 1 slices shown (11 of 12)]
[im 1/1]
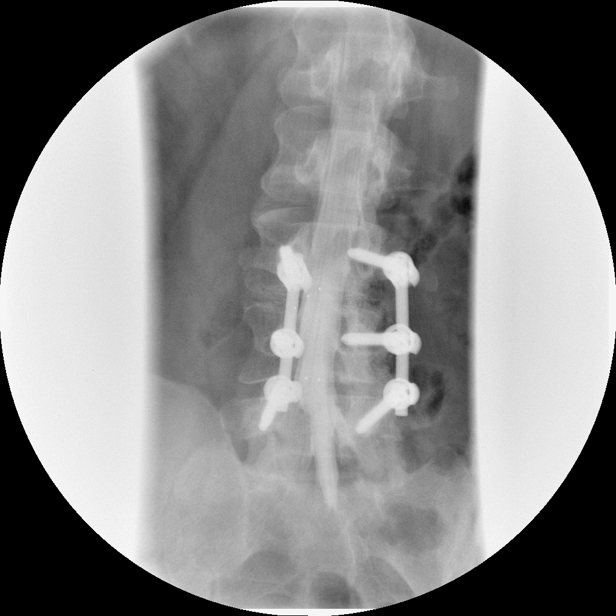

[Series 15: myelogram  white · 1 of 1 slices shown (12 of 12)]
[im 1/1]
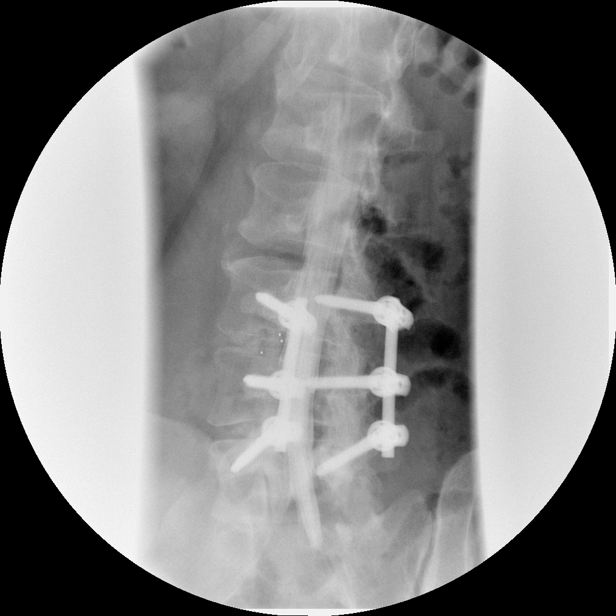

[13 of 14 positions shown; findings below may reference images not displayed]

IMPRESSION: Successful injection of  intrathecal contrast for myelography.

MYELOGRAM LUMBAR
FINDINGS: The patient is status post L3-4 and L4-5 PLIF.  The
hardware is intact.  The left S1 nerve root to does not fill out.
The right-sided nerve roots fill normally.  The remaining left
sided nerve roots fill normally.  On the last image there is some
filling of the left S1 nerve root although there is significant
medial displacement at the L5-S1 level.

There is no significant compromise above the fused levels.

The upright images demonstrate no significant change in alignment
of the lumbar spine.  No focal disc bulging is present with
standing or flexion/extension.
IMPRESSION: 1.  Medial displacement of the left S1 nerve root at the L5-S1
level.  This may be the source of the patient's pain.
2.  Status post L3-4 and L4-5 posterior fusion.
3.  No significant stenosis above the fused levels.

CT MYELOGRAPHY LUMBAR SPINE
FINDINGS: The lumbar spine is imaged from the midbody of T12-S3.
The patient is fused at L3-4 and L4-5.  There is bridging bone with
mature osseous fusion.  Hardware is intact.  The individual disc
levels are as follows.

Disc levels at L1-2 and above are normal.

L2-3:  Mild facet hypertrophy and ligamentum flavum thickening are
present without focal stenosis.

L3-4:  The patient is fused at this level.  Left laminectomy and
foraminotomy has been performed.  No significant residual or
recurrent stenosis is present.

L4-5:  The patient is fused anteriorly.  A left laminectomy has
been performed.  There is some residual spurring from the left-
sided facets despite a wide foraminotomy.  No focal stenosis is
evident.

L5-S1:  There is a left lateral filling defect.  The left S1 nerve
root is slightly displaced.  Mild facet hypertrophy is noted
bilaterally.  More inferiorly, there is a filling defect on the
right, below the S1 nerve level.  There is some facet hypertrophy
and spurring which potentially contacts the right S1 nerve root
lower.
IMPRESSION: 1.  Displacement of the left S1 nerve root in the lateral recess at
L5-S1, presumably from the disc protrusion.
2.  Facet spurring contacts the right S1 nerve root lower.
3.  Status post fusion at L3-4 and L4-5 with laminectomies and no
significant residual stenosis.
4.  Mild facet hypertrophy at L2-3 without significant stenosis.

## 2011-06-14 ENCOUNTER — Telehealth: Payer: Self-pay | Admitting: Internal Medicine

## 2011-06-14 MED ORDER — OXYCODONE-ACETAMINOPHEN 10-325 MG PO TABS: 1.0000 | ORAL_TABLET | Freq: Four times a day (QID) | ORAL | Status: DC | PRN

## 2011-06-14 NOTE — Telephone Encounter (Signed)
Pt is requesting  percocet prescription to be picked up---Pt ph [phone]

## 2011-06-14 NOTE — Telephone Encounter (Signed)
Done hardcopy to robin  

## 2011-06-14 NOTE — Telephone Encounter (Signed)
Patient informed that prescription is ready for pick up at front desk.

## 2011-07-05 ENCOUNTER — Telehealth: Payer: Self-pay | Admitting: *Deleted

## 2011-07-05 NOTE — Telephone Encounter (Signed)
Message copied by Marlowe Kays on Mon Jul 05, 2011  2:16 PM ------      Message from: Marlowe Kays      Created: Thu May 06, 2011  2:29 PM      Regarding: colonoscopy       Pt needs to schedule a colonoscopy

## 2011-07-06 ENCOUNTER — Encounter: Payer: Self-pay | Admitting: Gastroenterology

## 2011-07-06 ENCOUNTER — Encounter: Payer: Self-pay | Admitting: Internal Medicine

## 2011-07-06 ENCOUNTER — Telehealth: Payer: Self-pay | Admitting: Internal Medicine

## 2011-07-06 DIAGNOSIS — F419 Anxiety disorder, unspecified: Secondary | ICD-10-CM | POA: Insufficient documentation

## 2011-07-06 MED ORDER — CLONAZEPAM 0.5 MG PO TABS: ORAL_TABLET | ORAL | Status: DC

## 2011-07-06 NOTE — Telephone Encounter (Signed)
AMBIEN IS NOT WORKING. HE TOOK TWO AND THEY STILL DIDN'T WORK.  HE WANTS TO TRY XANAX.   HIS PHARMACY IS [facility] IN [place].

## 2011-07-06 NOTE — Telephone Encounter (Signed)
A similar medication that is "less addictive" is clonazepam - gave rx - Done hardcopy to robin

## 2011-07-07 NOTE — Telephone Encounter (Signed)
Called the patient left message to call back 

## 2011-07-07 NOTE — Telephone Encounter (Signed)
Called informed the patient and faxed hardcopy to [facility] per patient request.

## 2011-07-13 ENCOUNTER — Ambulatory Visit (AMBULATORY_SURGERY_CENTER): Payer: Medicare Other

## 2011-07-13 VITALS — Ht 69.0 in | Wt 257.9 lb

## 2011-07-13 DIAGNOSIS — K625 Hemorrhage of anus and rectum: Secondary | ICD-10-CM

## 2011-07-13 DIAGNOSIS — M549 Dorsalgia, unspecified: Secondary | ICD-10-CM

## 2011-07-13 DIAGNOSIS — R109 Unspecified abdominal pain: Secondary | ICD-10-CM

## 2011-07-13 DIAGNOSIS — Z1211 Encounter for screening for malignant neoplasm of colon: Secondary | ICD-10-CM

## 2011-07-13 MED ORDER — PEG-KCL-NACL-NASULF-NA ASC-C 100 G PO SOLR: 1.0000 | Freq: Once | ORAL | Status: AC

## 2011-07-14 ENCOUNTER — Telehealth: Payer: Self-pay

## 2011-07-14 NOTE — Telephone Encounter (Signed)
Needs ov

## 2011-07-14 NOTE — Telephone Encounter (Signed)
The patient blew his nose this AM and blood came out of his ear, please advise. ALSO, the patient is complaining since being on Lipitor has had body aches (took a percocet yesterday and did not help). HE is scheduled for a colonoscopy on Feb. 28 and Dr. Yves Dill informed the patient if he would like the endoscopy done at the same time would need a referral. Please advise, call back number is [phone]

## 2011-07-15 NOTE — Telephone Encounter (Signed)
Patient is scheduled for colonoscopy on 2/28

## 2011-07-15 NOTE — Telephone Encounter (Signed)
Called the patient informed to schedule OV.

## 2011-07-16 ENCOUNTER — Encounter: Payer: Self-pay | Admitting: Internal Medicine

## 2011-07-16 ENCOUNTER — Ambulatory Visit (INDEPENDENT_AMBULATORY_CARE_PROVIDER_SITE_OTHER): Payer: Medicare Other | Admitting: Internal Medicine

## 2011-07-16 VITALS — BP 102/64 | HR 90 | Temp 97.8°F | Resp 14 | Wt 254.8 lb

## 2011-07-16 DIAGNOSIS — E785 Hyperlipidemia, unspecified: Secondary | ICD-10-CM

## 2011-07-16 DIAGNOSIS — I10 Essential (primary) hypertension: Secondary | ICD-10-CM

## 2011-07-16 DIAGNOSIS — H6092 Unspecified otitis externa, left ear: Secondary | ICD-10-CM

## 2011-07-16 DIAGNOSIS — H60399 Other infective otitis externa, unspecified ear: Secondary | ICD-10-CM

## 2011-07-16 DIAGNOSIS — M549 Dorsalgia, unspecified: Secondary | ICD-10-CM

## 2011-07-16 MED ORDER — OXYCODONE HCL 15 MG PO TABS: 15.0000 mg | ORAL_TABLET | Freq: Four times a day (QID) | ORAL | Status: AC | PRN

## 2011-07-16 MED ORDER — NEOMYCIN-POLYMYXIN-HC 3.5-10000-1 OT SOLN: 3.0000 [drp] | Freq: Four times a day (QID) | OTIC | Status: AC

## 2011-07-16 NOTE — Patient Instructions (Signed)
Take all new medications as prescribed - the antibiotic drops;  Please call Mon Feb 18 for pill antibiotic if not improved OK to change the oxycodone to 15 mg four times per day as needed for pain Please re-start the lipitor in about 5 days as I think the pain to the legs may be coming from the lower back

## 2011-07-17 ENCOUNTER — Encounter: Payer: Self-pay | Admitting: Internal Medicine

## 2011-07-17 DIAGNOSIS — H6092 Unspecified otitis externa, left ear: Secondary | ICD-10-CM | POA: Insufficient documentation

## 2011-07-17 NOTE — Assessment & Plan Note (Signed)
stable overall by hx and exam, most recent data reviewed with pt, and pt to continue medical treatment as before BP Readings from Last 3 Encounters:  07/16/11 102/64  05/06/11 122/68  05/04/11 102/62

## 2011-07-17 NOTE — Progress Notes (Signed)
Subjective:    Patient ID: [name], male    DOB: [birth year], 41 y.o.   MRN: [id number]  HPI   Here with 3 days acute onset fever, and left ear discomfort with mild blood and tender noted this am,  and Pt denies chest pain, increased sob or doe, wheezing, orthopnea, PND, increased LE swelling, palpitations, dizziness or syncope.  Pt denies new neurological symptoms such as new headache, or facial or extremity weakness or numbness   Pt denies polydipsia, polyuria.  Pt continues to have recurring LBP without change in severity, bowel or bladder change, fever, wt loss,  worsening LE pain/numbness/weakness, gait change or falls., except for increased bilat upper leg discomfort he wants to attribute to the lipitor as a friend had to stop his lipitor due to myalgias recently.   Pt denies fever, wt loss, night sweats, loss of appetite, or other constitutional symptoms except for the above.  Overall good compliance with treatment, and good medicine tolerability  Current pain control not enough for ongoing pain. Past Medical History  Diagnosis Date  . BACK PAIN, CHRONIC 06/17/2007  . 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 HEMORRHOIDS, INTERNAL 06/17/2007  . RECTAL BLEEDING 06/17/2007  . Arthritis   . Hyperlipidemia   . Hypertension   . Lumbar disc disease 03/19/2011  . Depression 04/19/2011   Past Surgical History  Procedure Date  . Back surgery 2006 and 2011    x 2; Dr Leanne Lovely  . Colonoscopy   . Polypectomy   . Tonsillectomy and adenoidectomy     reports that he has been smoking Cigarettes.  He has a 58 pack-year smoking history. He has never used smokeless tobacco. He reports that he does not drink alcohol or use illicit drugs. family history includes Arthritis in his mother; Diabetes in his mother; Heart disease in his mother; and Hypertension in his mother.  There is no history of Colon cancer. Allergies  Allergen Reactions  . Morphine And Related   . Penicillins   . Tramadol   .  Valium    Current Outpatient Prescriptions on File Prior to Visit  Medication Sig Dispense Refill  . ciprofloxacin (CIPRO) 500 MG tablet Take 500 mg by mouth 2 (two) times daily. For 7 days       . clonazePAM (KLONOPIN) 0.5 MG tablet 1-2 tabs by mouth at bedtime for anxiety and sleep  60 tablet  1  . metroNIDAZOLE (FLAGYL) 500 MG tablet Take 500 mg by mouth 2 (two) times daily. For 7 days       . atorvastatin (LIPITOR) 20 MG tablet Take 1 tablet (20 mg total) by mouth daily.  90 tablet  3   Review of Systems Review of Systems  Constitutional: Negative for diaphoresis and unexpected weight change.  HENT: Negative for drooling and tinnitus.   Eyes: Negative for photophobia and visual disturbance.  Respiratory: Negative for choking and stridor.   Gastrointestinal: Negative for vomiting and blood in stool.  Genitourinary: Negative for hematuria and decreased urine volume.     Objective:   Physical Exam BP 102/64  Pulse 90  Temp(Src) 97.8 F (36.6 C) (Oral)  Resp 14  Wt 254 lb 12 oz (115.554 kg)  SpO2 96% Physical Exam  VS noted, mild ill Constitutional: Pt appears well-developed and well-nourished.  HENT: Head: Normocephalic.  Right Ear: External ear normal.  Left Ear: External ear normal. but mild tender to palpate, and left canal with 1+ red, swelling, tender without overt drainage. Eyes: Conjunctivae and  EOM are normal. Pupils are equal, round, and reactive to light.  Neck: Normal range of motion. Neck supple.  Cardiovascular: Normal rate and regular rhythm.   Pulmonary/Chest: Effort normal and breath sounds normal.  Abd:  Soft, NT, non-distended, + BS Neurological: Pt is alert. No cranial nerve deficit. Spine nontender to palpate  LE motor no change Skin: Skin is warm. No erythema.  Psychiatric: Pt behavior is normal. Thought content normal.     Assessment & Plan:

## 2011-07-17 NOTE — Assessment & Plan Note (Signed)
I suspect leg pain more likely related to back pain, ok to try re-start the statin , . to f/u any worsening symptoms or concerns

## 2011-07-17 NOTE — Assessment & Plan Note (Signed)
For change of pain med to oxycodone 15 qid,  to f/u any worsening symptoms or concerns

## 2011-07-17 NOTE — Assessment & Plan Note (Signed)
Mild to mod, for antibx course,  to f/u any worsening symptoms or concerns 

## 2011-07-29 ENCOUNTER — Encounter: Payer: Self-pay | Admitting: Gastroenterology

## 2011-07-29 ENCOUNTER — Ambulatory Visit (AMBULATORY_SURGERY_CENTER): Payer: Medicare Other | Admitting: Gastroenterology

## 2011-07-29 DIAGNOSIS — K648 Other hemorrhoids: Secondary | ICD-10-CM

## 2011-07-29 DIAGNOSIS — R109 Unspecified abdominal pain: Secondary | ICD-10-CM

## 2011-07-29 DIAGNOSIS — Z1211 Encounter for screening for malignant neoplasm of colon: Secondary | ICD-10-CM

## 2011-07-29 MED ORDER — HYOSCYAMINE SULFATE ER 0.375 MG PO TBCR: EXTENDED_RELEASE_TABLET | ORAL | Status: DC

## 2011-07-29 MED ORDER — SODIUM CHLORIDE 0.9 % IV SOLN: 500.0000 mL | INTRAVENOUS | Status: DC

## 2011-07-29 NOTE — Patient Instructions (Signed)
Discharge instructions given with verbal understanding. Resume previous medications.YOU HAD AN ENDOSCOPIC PROCEDURE TODAY AT THE [facility]: Refer to the procedure report that was given to you for any specific questions about what was found during the examination.  If the procedure report does not answer your questions, please call your gastroenterologist to clarify.  If you requested that your care partner not be given the details of your procedure findings, then the procedure report has been included in a sealed envelope for you to review at your convenience later.  YOU SHOULD EXPECT: Some feelings of bloating in the abdomen. Passage of more gas than usual.  Walking can help get rid of the air that was put into your GI tract during the procedure and reduce the bloating. If you had a lower endoscopy (such as a colonoscopy or flexible sigmoidoscopy) you may notice spotting of blood in your stool or on the toilet paper. If you underwent a bowel prep for your procedure, then you may not have a normal bowel movement for a few days.  DIET: Your first meal following the procedure should be a light meal and then it is ok to progress to your normal diet.  A half-sandwich or bowl of soup is an example of a good first meal.  Heavy or fried foods are harder to digest and may make you feel nauseous or bloated.  Likewise meals heavy in dairy and vegetables can cause extra gas to form and this can also increase the bloating.  Drink plenty of fluids but you should avoid alcoholic beverages for 24 hours.  ACTIVITY: Your care partner should take you home directly after the procedure.  You should plan to take it easy, moving slowly for the rest of the day.  You can resume normal activity the day after the procedure however you should NOT DRIVE or use heavy machinery for 24 hours (because of the sedation medicines used during the test).    SYMPTOMS TO REPORT IMMEDIATELY: A gastroenterologist can be reached at  any hour.  During normal business hours, 8:30 AM to 5:00 PM Monday through Friday, call [phone].  After hours and on weekends, please call the GI answering service at [phone] who will take a message and have the physician on call contact you.   Following lower endoscopy (colonoscopy or flexible sigmoidoscopy):  Excessive amounts of blood in the stool  Significant tenderness or worsening of abdominal pains  Swelling of the abdomen that is new, acute  Fever of 100F or higher  FOLLOW UP: If any biopsies were taken you will be contacted by phone or by letter within the next 1-3 weeks.  Call your gastroenterologist if you have not heard about the biopsies in 3 weeks.  Our staff will call the home number listed on your records the next business day following your procedure to check on you and address any questions or concerns that you may have at that time regarding the information given to you following your procedure. This is a courtesy call and so if there is no answer at the home number and we have not heard from you through the emergency physician on call, we will assume that you have returned to your regular daily activities without incident.  SIGNATURES/CONFIDENTIALITY: You and/or your care partner have signed paperwork which will be entered into your electronic medical record.  These signatures attest to the fact that that the information above on your After Visit Summary has been reviewed and is understood.    Full responsibility of the confidentiality of this discharge information lies with you and/or your care-partner.  

## 2011-07-29 NOTE — Op Note (Signed)
[facility] [address] [place], [place]  [address]  COLONOSCOPY PROCEDURE REPORT  PATIENT:  [name], [name]  MR#:  [id number] BIRTHDATE:  07-03-1970, 40 yrs. old  GENDER:  male ENDOSCOPIST:  Dennis Hair. Arlyce Dice, MD REF. BY:  Oliver Barre, M.D. PROCEDURE DATE:  07/29/2011 PROCEDURE:  Diagnostic Colonoscopy ASA CLASS:  Class II INDICATIONS:  Abdominal pain MEDICATIONS:   MAC sedation, administered by CRNA propofol 300mg IV  DESCRIPTION OF PROCEDURE:   After the risks benefits and alternatives of the procedure were thoroughly explained, informed consent was obtained.  Digital rectal exam was performed and revealed no abnormalities.   The LB CF-H180AL [id number] endoscope was introduced through the anus and advanced to the cecum, which was identified by both the appendix and ileocecal valve, without limitations.  The quality of the prep was good, using MoviPrep. The instrument was then slowly withdrawn as the colon was fully examined. <<PROCEDUREIMAGES>>  FINDINGS:  Internal Hemorrhoids were found (see image2).  This was otherwise a normal examination of the colon (see image1). Retroflexed views in the rectum revealed no abnormalities.    The time to cecum =  1) 1.50  minutes. The scope was then withdrawn in 1) 6.0  minutes from the cecum and the procedure completed. COMPLICATIONS:  None ENDOSCOPIC IMPRESSION: 1) Internal hemorrhoids 2) Otherwise normal examination RECOMMENDATIONS:Hyomax prn  REPEAT EXAM:  In 10 year(s) for colonoscopy  ______________________________ Dennis Hair. Arlyce Dice, MD  CC:  n. eSIGNED:   Barbette Hair. Mcgregor Quinn at 07/29/2011 09:53 AM  Arman Bogus, [id number]

## 2011-07-29 NOTE — Progress Notes (Signed)
Patient did not experience any of the following events: a burn prior to discharge; a fall within the facility; wrong site/side/patient/procedure/implant event; or a hospital transfer or hospital admission upon discharge from the facility. (G8907) Patient did not have preoperative order for IV antibiotic SSI prophylaxis. (G8918)  

## 2011-07-30 ENCOUNTER — Telehealth: Payer: Self-pay | Admitting: *Deleted

## 2011-07-30 NOTE — Telephone Encounter (Signed)
  Follow up Call-  Call back number 07/29/2011  Post procedure Call Back phone  # [phone]  Permission to leave phone message Yes     [facility]

## 2011-08-11 ENCOUNTER — Ambulatory Visit (INDEPENDENT_AMBULATORY_CARE_PROVIDER_SITE_OTHER): Payer: Medicare Other | Admitting: Internal Medicine

## 2011-08-11 ENCOUNTER — Encounter: Payer: Self-pay | Admitting: Internal Medicine

## 2011-08-11 VITALS — BP 102/60 | HR 76 | Temp 97.5°F | Ht 69.0 in | Wt 262.5 lb

## 2011-08-11 DIAGNOSIS — M549 Dorsalgia, unspecified: Secondary | ICD-10-CM

## 2011-08-11 DIAGNOSIS — E785 Hyperlipidemia, unspecified: Secondary | ICD-10-CM

## 2011-08-11 DIAGNOSIS — R109 Unspecified abdominal pain: Secondary | ICD-10-CM

## 2011-08-11 DIAGNOSIS — K219 Gastro-esophageal reflux disease without esophagitis: Secondary | ICD-10-CM | POA: Insufficient documentation

## 2011-08-11 MED ORDER — OMEPRAZOLE 20 MG PO CPDR: 20.0000 mg | DELAYED_RELEASE_CAPSULE | Freq: Every day | ORAL | Status: DC

## 2011-08-11 MED ORDER — OXYCODONE HCL 10 MG PO TABS: 10.0000 mg | ORAL_TABLET | Freq: Four times a day (QID) | ORAL | Status: DC | PRN

## 2011-08-11 NOTE — Progress Notes (Signed)
Subjective:    Patient ID: [name], male    DOB: 1971/03/25, 41 y.o.   MRN: [id number]  HPI Here to f/u with c/o ongoing abd pain, with some increased severity x 4 days;  Had a singificant flare 4 days ago, resultsin in tremulousness, sweating, and upper abd/low sternal pain, crampy, dull without radaioaitn, with some nausea, no vomiting, no fever.  BM's now 1-2 per wk.  Has not tried laxative or stool softner.  Still on chronic narcotic, but not really improved pain control per pt with incr from oxycodone 10 to 15 mg last visit.  Was seen at [facility] ER where reflux and minor dyspaghia also noted, asked to take otc acid reliever.  Did have CT recent jan 2013 at [facility], as well as recent colonscopy (neg for polyp or malignancy) per Dr Arlyce Dice.  Tx wth hyoscymine, asked to call back in 2 wks if not improved.  No signicant exercise, on recent diet change.  Has been off the lipitor for several wks, apparently not related to constipation? Pt denies other chest pain, increased sob or doe, wheezing, orthopnea, PND, increased LE swelling, palpitations, dizziness or syncope. Pt denies new neurological symptoms such as new headache, or facial or extremity weakness or numbness   Pt denies polydipsia, polyuria Past Medical History  Diagnosis Date  . BACK PAIN, CHRONIC 06/17/2007  . 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 HEMORRHOIDS, INTERNAL 06/17/2007  . RECTAL BLEEDING 06/17/2007  . Arthritis   . Hyperlipidemia   . Hypertension   . Lumbar disc disease 03/19/2011  . Depression 04/19/2011   Past Surgical History  Procedure Date  . Back surgery 2006 and 2011    x 2; Dr Leanne Lovely  . Colonoscopy   . Polypectomy   . Tonsillectomy and adenoidectomy     reports that he has been smoking Cigarettes.  He has a 58 pack-year smoking history. He has never used smokeless tobacco. He reports that he does not drink alcohol or use illicit drugs. family history includes Arthritis in his mother; Diabetes in his  mother; Heart disease in his mother; and Hypertension in his mother.  There is no history of Colon cancer. Allergies  Allergen Reactions  . Morphine And Related   . Penicillins   . Tramadol   . Valium    Current Outpatient Prescriptions on File Prior to Visit  Medication Sig Dispense Refill  . clonazePAM (KLONOPIN) 0.5 MG tablet 1-2 tabs by mouth at bedtime for anxiety and sleep  60 tablet  1  . Hyoscyamine Sulfate 0.375 MG TBCR Take 1 tab bid prn abdominal pain  30 each  1  . atorvastatin (LIPITOR) 20 MG tablet Take 1 tablet (20 mg total) by mouth daily.  90 tablet  3   Review of Systems Review of Systems  Constitutional: Negative for diaphoresis and unexpected weight change.  HENT: Negative for drooling and tinnitus.   Eyes: Negative for photophobia and visual disturbance.  Respiratory: Negative for choking and stridor.   Gastrointestinal: Negative for vomiting and blood in stool.  Genitourinary: Negative for hematuria and decreased urine volume.      Objective:   Physical Exam BP 102/60  Pulse 76  Temp(Src) 97.5 F (36.4 C) (Oral)  Ht 5\' 9"  (1.753 m)  Wt 262 lb 8 oz (119.069 kg)  BMI 38.76 kg/m2  SpO2 97% Physical Exam  VS noted, not ill appearing, able to get up on exam table easily Constitutional: Pt appears well-developed and well-nourished.  HENT: Head:  Normocephalic.  Right Ear: External ear normal.  Left Ear: External ear normal.  Eyes: Conjunctivae and EOM are normal. Pupils are equal, round, and reactive to light.  Neck: Normal range of motion. Neck supple.  Cardiovascular: Normal rate and regular rhythm.   Pulmonary/Chest: Effort normal and breath sounds normal.  Abd:  Soft, somwehat distended, + BS , NT except for mild tender today above right inguinal area but no swelling Neurological: Pt is alert. No cranial nerve deficit.  Skin: Skin is warm. No erythema.  Psychiatric: Pt behavior is normal. Thought content normal.     Assessment & Plan:

## 2011-08-11 NOTE — Assessment & Plan Note (Signed)
C/w ongoing constipation/? IBS like;  Ok to cont hyoscymine prn, add senakot qhs, MOM prn, watch diet , increase exercise, f/u GI as needed

## 2011-08-11 NOTE — Assessment & Plan Note (Signed)
With minor dysphagia-like symptoms, for prilosec 20 qd,  to f/u any worsening symptoms or concerns

## 2011-08-11 NOTE — Assessment & Plan Note (Signed)
Ok to stay off the lipitor for now to avoid any further constipation if contributing, but should re-start in 2 wks

## 2011-08-11 NOTE — Assessment & Plan Note (Signed)
To try decr oxycodone 10 qid as 15 did not seem to help signficiantly and could contribute to constipation

## 2011-08-11 NOTE — Patient Instructions (Signed)
Please start the prilosec 20 mg per day for acid Take all new medications as prescribed - the oxycodone at 10 mg Please start senakot OTC  - 1 per night You can also use MOM otc as needed You should also take colace (or similar stool softner) twice per day as needed for hard stools Please try to be more active, which helps with constipation OK to re-start the Lipitor in 2 weeks

## 2011-09-13 ENCOUNTER — Telehealth: Payer: Self-pay | Admitting: Internal Medicine

## 2011-09-13 MED ORDER — OXYCODONE HCL 10 MG PO TABS: 10.0000 mg | ORAL_TABLET | Freq: Four times a day (QID) | ORAL | Status: DC | PRN

## 2011-09-13 NOTE — Telephone Encounter (Signed)
Done hardcopy to robin  

## 2011-09-13 NOTE — Telephone Encounter (Signed)
The pt called and is hoping to get a refill of oxycodone 10mg  sent to the [facility] in [place].  Thanks!

## 2011-09-13 NOTE — Telephone Encounter (Signed)
Called the patient left message that prescription requested is ready for pickup at the front desk. 

## 2011-09-17 ENCOUNTER — Ambulatory Visit (INDEPENDENT_AMBULATORY_CARE_PROVIDER_SITE_OTHER): Payer: Medicare Other | Admitting: Internal Medicine

## 2011-09-17 ENCOUNTER — Encounter: Payer: Self-pay | Admitting: Internal Medicine

## 2011-09-17 VITALS — BP 112/60 | HR 91 | Temp 97.6°F | Ht 69.0 in | Wt 254.8 lb

## 2011-09-17 DIAGNOSIS — E785 Hyperlipidemia, unspecified: Secondary | ICD-10-CM

## 2011-09-17 DIAGNOSIS — I10 Essential (primary) hypertension: Secondary | ICD-10-CM

## 2011-09-17 DIAGNOSIS — M129 Arthropathy, unspecified: Secondary | ICD-10-CM

## 2011-09-17 DIAGNOSIS — M549 Dorsalgia, unspecified: Secondary | ICD-10-CM

## 2011-09-17 DIAGNOSIS — F329 Major depressive disorder, single episode, unspecified: Secondary | ICD-10-CM

## 2011-09-17 MED ORDER — PREDNISONE 10 MG PO TABS: ORAL_TABLET | ORAL | Status: DC

## 2011-09-17 MED ORDER — KETOROLAC TROMETHAMINE 30 MG/ML IJ SOLN
30.0000 mg | Freq: Once | INTRAMUSCULAR | Status: AC
  Administered 2011-09-17: 30 mg via INTRAMUSCULAR

## 2011-09-17 MED ORDER — OXYCODONE HCL 10 MG PO TABS: 10.0000 mg | ORAL_TABLET | Freq: Four times a day (QID) | ORAL | Status: DC | PRN

## 2011-09-17 NOTE — Patient Instructions (Signed)
You had the toradol shot today for pain (30 mg) Continue all other medications as before You are given the refills as requested Please have the pharmacy call with any other refills you may need. Please return in 6 months, or sooner if needed

## 2011-09-17 NOTE — Assessment & Plan Note (Signed)
With mild flare today - for toradol 30 mg IM now, and refill chronic meds,  to f/u any worsening symptoms or concerns

## 2011-09-18 ENCOUNTER — Encounter: Payer: Self-pay | Admitting: Internal Medicine

## 2011-09-18 NOTE — Assessment & Plan Note (Signed)
stable overall by hx and exam, most recent data reviewed with pt, and pt to continue medical treatment as before  Lab Results  Component Value Date   WBC 11.9* 05/04/2011   HGB 13.9 05/04/2011   HCT 41.1 05/04/2011   PLT 425.0* 05/04/2011   GLUCOSE 94 05/04/2011   CHOL 175 04/19/2011   TRIG 139.0 04/19/2011   HDL 39.90 04/19/2011   LDLDIRECT 207.4 03/19/2011   LDLCALC 107* 04/19/2011   ALT 12 05/04/2011   AST 18 05/04/2011   NA 139 05/04/2011   K 4.4 05/04/2011   CL 103 05/04/2011   CREATININE 1.0 05/04/2011   BUN 14 05/04/2011   CO2 25 05/04/2011   TSH 1.41 03/19/2011   PSA 0.39 03/19/2011   INR 0.98 09/29/2009

## 2011-09-18 NOTE — Assessment & Plan Note (Signed)
stable overall by hx and exam, most recent data reviewed with pt, and pt to continue medical treatment as before  Lab Results  Component Value Date   LDLCALC 107* 04/19/2011

## 2011-09-18 NOTE — Progress Notes (Signed)
Subjective:    Patient ID: [name], male    DOB: 09/28/1970, 41 y.o.   MRN: [id number]  HPI  Here to f/u; overall doing ok,  Pt denies chest pain, increased sob or doe, wheezing, orthopnea, PND, increased LE swelling, palpitations, dizziness or syncope.  Pt denies polydipsia, polyuria,   Pt states overall good compliance with meds, trying to follow lower cholesterol diet, wt overall stable but little exercise however. Pt denies new neurological symptoms such as new headache, or facial or extremity weakness or numbness, except did mow the grass 2 days ago with acute on chronic pain with worsening left back and leg pain, without LE numb/weakness, bowel or bladder change, fever, wt loss,  gait change or falls.  Denies worsening depressive symptoms, suicidal ideation, or panic. Past Medical History  Diagnosis Date  . BACK PAIN, CHRONIC 06/17/2007  . 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 HEMORRHOIDS, INTERNAL 06/17/2007  . RECTAL BLEEDING 06/17/2007  . Arthritis   . Hyperlipidemia   . Hypertension   . Lumbar disc disease 03/19/2011  . Depression 04/19/2011   Past Surgical History  Procedure Date  . Back surgery 2006 and 2011    x 2; Dr Leanne Lovely  . Colonoscopy   . Polypectomy   . Tonsillectomy and adenoidectomy     reports that he has been smoking Cigarettes.  He has a 58 pack-year smoking history. He has never used smokeless tobacco. He reports that he does not drink alcohol or use illicit drugs. family history includes Arthritis in his mother; Diabetes in his mother; Heart disease in his mother; and Hypertension in his mother.  There is no history of Colon cancer. Allergies  Allergen Reactions  . Morphine And Related   . Penicillins   . Tramadol   . Valium    Current Outpatient Prescriptions on File Prior to Visit  Medication Sig Dispense Refill  . atorvastatin (LIPITOR) 20 MG tablet Take 1 tablet (20 mg total) by mouth daily.  90 tablet  3  . clonazePAM (KLONOPIN) 0.5 MG  tablet 1-2 tabs by mouth at bedtime for anxiety and sleep  60 tablet  1  . Hyoscyamine Sulfate 0.375 MG TBCR Take 1 tab bid prn abdominal pain  30 each  1  . omeprazole (PRILOSEC) 20 MG capsule Take 1 capsule (20 mg total) by mouth daily.  30 capsule  11   Review of Systems Review of Systems  Constitutional: Negative for diaphoresis and unexpected weight change.  Gastrointestinal: Negative for vomiting and blood in stool.  Genitourinary: Negative for hematuria and decreased urine volume.  Musculoskeletal: Negative for gait problem.  Skin: Negative for color change and wound.  Neurological: Negative for tremors and numbness.  Psychiatric/Behavioral: Negative for decreased concentration. The patient is not hyperactive.      Objective:   Physical Exam BP 112/60  Pulse 91  Temp(Src) 97.6 F (36.4 C) (Oral)  Ht 5\' 9"  (1.753 m)  Wt 254 lb 12.8 oz (115.577 kg)  BMI 37.63 kg/m2  SpO2 96% Physical Exam  VS noted, not ill appearing Constitutional: Pt appears well-developed and well-nourished.  HENT: Head: Normocephalic.  Right Ear: External ear normal.  Left Ear: External ear normal.  Eyes: Conjunctivae and EOM are normal. Pupils are equal, round, and reactive to light.  Neck: Normal range of motion. Neck supple.  Cardiovascular: Normal rate and regular rhythm.   Pulmonary/Chest: Effort normal and breath sounds normal.  Abd:  Soft, NT, non-distended, + BS Neurological: Pt is alert. Motor  4+/5 LLE, dtr/gait intact  Skin: Skin is warm. No erythema. No rash Spine nontender to palpate Psychiatric: Pt behavior is normal. Thought content normal. not depressed affect, 1+ nervous    Assessment & Plan:

## 2011-09-18 NOTE — Assessment & Plan Note (Signed)
stable overall by hx and exam, most recent data reviewed with pt, and pt to continue medical treatment as before  BP Readings from Last 3 Encounters:  09/17/11 112/60  08/11/11 102/60  07/29/11 115/72

## 2011-10-01 ENCOUNTER — Telehealth: Payer: Self-pay

## 2011-10-01 NOTE — Telephone Encounter (Signed)
Pt states he has a "fever" in his stomach and it hurts. Asked pt what his temperature is and he states he doesn't have a "body fever," just "fever" in his stomach. Pt describes it like a burning sensation in his stomach. Pt scheduled to see Dr. Arlyce Dice Monday 10/04/11@2 :45pm. Pt aware of appt date and time.

## 2011-10-04 ENCOUNTER — Encounter: Payer: Self-pay | Admitting: Gastroenterology

## 2011-10-04 ENCOUNTER — Ambulatory Visit (INDEPENDENT_AMBULATORY_CARE_PROVIDER_SITE_OTHER): Payer: Medicare Other | Admitting: Gastroenterology

## 2011-10-04 ENCOUNTER — Other Ambulatory Visit (INDEPENDENT_AMBULATORY_CARE_PROVIDER_SITE_OTHER): Payer: Medicare Other

## 2011-10-04 VITALS — BP 132/74 | HR 80 | Ht 69.0 in | Wt 252.0 lb

## 2011-10-04 DIAGNOSIS — D126 Benign neoplasm of colon, unspecified: Secondary | ICD-10-CM

## 2011-10-04 DIAGNOSIS — R109 Unspecified abdominal pain: Secondary | ICD-10-CM

## 2011-10-04 DIAGNOSIS — R1013 Epigastric pain: Secondary | ICD-10-CM | POA: Insufficient documentation

## 2011-10-04 LAB — COMPREHENSIVE METABOLIC PANEL
BUN: 14 mg/dL (ref 6–23)
CO2: 26 mEq/L (ref 19–32)
Calcium: 9.2 mg/dL (ref 8.4–10.5)
Chloride: 100 mEq/L (ref 96–112)
Creatinine, Ser: 0.9 mg/dL (ref 0.4–1.5)
GFR: 96.82 mL/min (ref >60.00)

## 2011-10-04 LAB — CBC WITH DIFFERENTIAL/PLATELET
Basophils Relative: 1.4 % (ref 0.0–3.0)
HCT: 43.1 % (ref 39.0–52.0)
Hemoglobin: 14.6 g/dL (ref 13.0–17.0)
Lymphocytes Relative: 26.8 % (ref 12.0–46.0)
MCHC: 33.8 g/dL (ref 30.0–36.0)
Monocytes Relative: 4.9 % (ref 3.0–12.0)
Neutro Abs: 6.9 10*3/uL (ref 1.4–7.7)
RBC: 4.73 Mil/uL (ref 4.22–5.81)

## 2011-10-04 LAB — LIPASE: Lipase: 22 U/L (ref 11.0–59.0)

## 2011-10-04 MED ORDER — HYOSCYAMINE SULFATE ER 0.375 MG PO TBCR: EXTENDED_RELEASE_TABLET | ORAL | Status: DC

## 2011-10-04 NOTE — Assessment & Plan Note (Addendum)
The patient has several months of upper abdominal pain that is worsened postprandially and not improved with omeprazole. It may be noteworthy that he underwent CT scan several months ago because of lower abdominal pain with negative findings. Symptoms could be due to ulcer or nonulcer dyspepsia. Pain from chronic pancreatitis or neoplasm, or from biliary tract disease are other possibilities, albeit less likely.  Recommendations #1 check CBC, amylase, lipase and comprehensive metabolic profile #2 upper endoscopy pending results of blood work #3 trial of hyomax #4 continue PPI therapy

## 2011-10-04 NOTE — Assessment & Plan Note (Signed)
Following colonoscopy 2023

## 2011-10-04 NOTE — Patient Instructions (Signed)
Your Endoscopy is scheduled on 10/12/2011 at 3:30pm  Separate instructions have been given You will go to the basement for labs today  Upper GI Endoscopy Upper GI endoscopy means using a flexible scope to look at the esophagus, stomach, and upper small bowel. This is done to make a diagnosis in people with heartburn, abdominal pain, or abnormal bleeding. Sometimes an endoscope is needed to remove foreign bodies or food that become stuck in the esophagus; it can also be used to take biopsy samples. For the best results, do not eat or drink for 8 hours before having your upper endoscopy.  To perform the endoscopy, you will probably be sedated and your throat will be numbed with a special spray. The endoscope is then slowly passed down your throat (this will not interfere with your breathing). An endoscopy exam takes 15 to 30 minutes to complete and there is no real pain. Patients rarely remember much about the procedure. The results of the test may take several days if a biopsy or other test is taken.  You may have a sore throat after an endoscopy exam. Serious complications are very rare. Stick to liquids and soft foods until your pain is better. Do not drive a car or operate any dangerous equipment for at least 24 hours after being sedated. SEEK IMMEDIATE MEDICAL CARE IF:   You have severe throat pain.   You have shortness of breath.   You have bleeding problems.   You have a fever.   You have difficulty recovering from your sedation.  Document Released: 06/24/2004 Document Revised: 05/06/2011 Document Reviewed: 05/19/2008 [facility] Patient Information 2012 [place], [place].

## 2011-10-04 NOTE — Progress Notes (Signed)
History of Present Illness:  [name] has returned for evaluation of upper abdominal pain. For the last 2 months he's been complaining of persistent upper abdominal pain. Pain radiates  across his upper abdomen and worsened postprandially.  It is an everyday occurrence. Symptoms are not improved since adding omeprazole. He denies nausea, pyrosis or change in bowel habits. He is on no gastric irritants including nonsteroidals.  Colonoscopy in February, 2013 was unremarkable. This was performed because of lower abdominal pain. CT scan in [facility] in November or December, 2012 apparently was unremarkable.    Review of Systems: Pertinent positive and negative review of systems were noted in the above HPI section. All other review of systems were otherwise negative.    Current Medications, Allergies, Past Medical History, Past Surgical History, Family History and Social History were reviewed in [employer] electronic medical record  Vital signs were reviewed in today's medical record. Physical Exam: General: Well developed , well nourished, no acute distress Head: Normocephalic and atraumatic Eyes:  sclerae anicteric, EOMI Ears: Normal auditory acuity Mouth: No deformity or lesions Lungs: Clear throughout to auscultation Heart: Regular rate and rhythm; no murmurs, rubs or bruits Abdomen: Soft,and non distended. No masses, hepatosplenomegaly or hernias noted. Normal Bowel sounds. There is mild tenderness in the midepigastrium without guarding or rebound Rectal:deferred Musculoskeletal: Symmetrical with no gross deformities  Pulses:  Normal pulses noted Extremities: No clubbing, cyanosis, edema or deformities noted Neurological: Alert oriented x 4, grossly nonfocal Psychological:  Alert and cooperative. Normal mood and affect

## 2011-10-05 ENCOUNTER — Telehealth: Payer: Self-pay | Admitting: Gastroenterology

## 2011-10-05 MED ORDER — HYOSCYAMINE SULFATE 0.125 MG SL SUBL: 0.1250 mg | SUBLINGUAL_TABLET | Freq: Four times a day (QID) | SUBLINGUAL | Status: DC | PRN

## 2011-10-05 NOTE — Telephone Encounter (Signed)
Patient stated his pharmacy did not have hyoscyamine .375 so I resubmitted rx for hyomax 0.125 SL every 6 hours as needed

## 2011-10-05 NOTE — Telephone Encounter (Signed)
ok 

## 2011-10-12 ENCOUNTER — Encounter: Payer: Self-pay | Admitting: Gastroenterology

## 2011-10-12 ENCOUNTER — Ambulatory Visit (AMBULATORY_SURGERY_CENTER): Payer: Medicare Other | Admitting: Gastroenterology

## 2011-10-12 VITALS — BP 121/75 | HR 71 | Temp 96.3°F | Resp 19 | Ht 69.0 in | Wt 252.0 lb

## 2011-10-12 DIAGNOSIS — R109 Unspecified abdominal pain: Secondary | ICD-10-CM

## 2011-10-12 DIAGNOSIS — R1013 Epigastric pain: Secondary | ICD-10-CM

## 2011-10-12 MED ORDER — SODIUM CHLORIDE 0.9 % IV SOLN: 500.0000 mL | INTRAVENOUS | Status: DC

## 2011-10-12 NOTE — Progress Notes (Signed)
The pt tolerated the egd very well. Maw   

## 2011-10-12 NOTE — Progress Notes (Signed)
Patient did not have preoperative order for IV antibiotic SSI prophylaxis. (G8918)  Patient did not experience any of the following events: a burn prior to discharge; a fall within the facility; wrong site/side/patient/procedure/implant event; or a hospital transfer or hospital admission upon discharge from the facility. (G8907)  

## 2011-10-12 NOTE — Op Note (Signed)
[facility] [address] [place], [place]  [address]  ENDOSCOPY PROCEDURE REPORT  PATIENT:  [name], [name]  MR#:  [id number] BIRTHDATE:  September 13, 1970, 40 yrs. old  GENDER:  male  ENDOSCOPIST:  Barbette Hair. Arlyce Dice, MD Referred by:  PROCEDURE DATE:  10/12/2011 PROCEDURE:  EGD, diagnostic 43235 ASA CLASS:  Class II INDICATIONS:  abdominal pain  MEDICATIONS:   MAC sedation, administered by CRNA propofol 150mg IV, glycopyrrolate (Robinal) 0.2 mg IV, 0.6cc simethancone 0.6 cc PO TOPICAL ANESTHETIC:  DESCRIPTION OF PROCEDURE:   After the risks and benefits of the procedure were explained, informed consent was obtained.  The LB GIF-H180 [id number] endoscope was introduced through the mouth and advanced to the third portion of the duodenum.  The instrument was slowly withdrawn as the mucosa was fully examined. <<PROCEDUREIMAGES>>  normal otherwise (see image1, image3, image4, image5, image6, and image8).    Retroflexed views revealed no abnormalities.    The scope was then withdrawn from the patient and the procedure completed.  COMPLICATIONS:  None  ENDOSCOPIC IMPRESSION: 1) Normal EGD  RECOMMENDATIONS:Hold omeprazole and levsin OV 2-3 weeks  ______________________________ Barbette Hair. Arlyce Dice, MD  CC:  Corwin Levins, MD  n. Rosalie Doctor:   Barbette Hair. Kalandra Masters at 10/12/2011 03:35 PM  Arman Bogus, [phone]

## 2011-10-12 NOTE — Patient Instructions (Addendum)
Impressions/Recommendations:  Normal EGD  Hold Omeprazole and Levsin. Call the office to schedule an office visit for 2-3 weeks.  YOU HAD AN ENDOSCOPIC PROCEDURE TODAY AT THE [facility]: Refer to the procedure report that was given to you for any specific questions about what was found during the examination.  If the procedure report does not answer your questions, please call your gastroenterologist to clarify.  If you requested that your care partner not be given the details of your procedure findings, then the procedure report has been included in a sealed envelope for you to review at your convenience later.  YOU SHOULD EXPECT: Some feelings of bloating in the abdomen. Passage of more gas than usual.  Walking can help get rid of the air that was put into your GI tract during the procedure and reduce the bloating. If you had a lower endoscopy (such as a colonoscopy or flexible sigmoidoscopy) you may notice spotting of blood in your stool or on the toilet paper. If you underwent a bowel prep for your procedure, then you may not have a normal bowel movement for a few days.  DIET: Your first meal following the procedure should be a light meal and then it is ok to progress to your normal diet.  A half-sandwich or bowl of soup is an example of a good first meal.  Heavy or fried foods are harder to digest and may make you feel nauseous or bloated.  Likewise meals heavy in dairy and vegetables can cause extra gas to form and this can also increase the bloating.  Drink plenty of fluids but you should avoid alcoholic beverages for 24 hours.  ACTIVITY: Your care partner should take you home directly after the procedure.  You should plan to take it easy, moving slowly for the rest of the day.  You can resume normal activity the day after the procedure however you should NOT DRIVE or use heavy machinery for 24 hours (because of the sedation medicines used during the test).    SYMPTOMS TO REPORT  IMMEDIATELY: A gastroenterologist can be reached at any hour.  During normal business hours, 8:30 AM to 5:00 PM Monday through Friday, call [phone].  After hours and on weekends, please call the GI answering service at [phone] who will take a message and have the physician on call contact you.   Following lower endoscopy (colonoscopy or flexible sigmoidoscopy):  Excessive amounts of blood in the stool  Significant tenderness or worsening of abdominal pains  Swelling of the abdomen that is new, acute  Fever of 100F or higher  Following upper endoscopy (EGD)  Vomiting of blood or coffee ground material  New chest pain or pain under the shoulder blades  Painful or persistently difficult swallowing  New shortness of breath  Fever of 100F or higher  Black, tarry-looking stools  FOLLOW UP: If any biopsies were taken you will be contacted by phone or by letter within the next 1-3 weeks.  Call your gastroenterologist if you have not heard about the biopsies in 3 weeks.  Our staff will call the home number listed on your records the next business day following your procedure to check on you and address any questions or concerns that you may have at that time regarding the information given to you following your procedure. This is a courtesy call and so if there is no answer at the home number and we have not heard from you through the emergency physician on  call, we will assume that you have returned to your regular daily activities without incident.  SIGNATURES/CONFIDENTIALITY: You and/or your care partner have signed paperwork which will be entered into your electronic medical record.  These signatures attest to the fact that that the information above on your After Visit Summary has been reviewed and is understood.  Full responsibility of the confidentiality of this discharge information lies with you and/or your care-partner.   STOP omeprazole (prilosec).  Call back in 1 week if  you still have abdominal pain.

## 2011-10-13 ENCOUNTER — Telehealth: Payer: Self-pay

## 2011-10-13 NOTE — Telephone Encounter (Signed)
  Follow up Call-  Call back number 10/12/2011 07/29/2011  Post procedure Call Back phone  # [phone]  Permission to leave phone message Yes Yes     Patient questions:  Do you have a fever, pain , or abdominal swelling? no Pain Score  0 *  Have you tolerated food without any problems? yes  Have you been able to return to your normal activities? yes  Do you have any questions about your discharge instructions: Diet   no Medications  no Follow up visit  no  Do you have questions or concerns about your Care? no  Actions: * If pain score is 4 or above: No action needed, pain <4.

## 2011-11-10 ENCOUNTER — Encounter: Payer: Self-pay | Admitting: Gastroenterology

## 2011-11-10 ENCOUNTER — Ambulatory Visit (INDEPENDENT_AMBULATORY_CARE_PROVIDER_SITE_OTHER): Payer: Medicare Other | Admitting: Gastroenterology

## 2011-11-10 VITALS — BP 122/74 | HR 92 | Ht 69.0 in | Wt 249.0 lb

## 2011-11-10 DIAGNOSIS — R1013 Epigastric pain: Secondary | ICD-10-CM

## 2011-11-10 DIAGNOSIS — R079 Chest pain, unspecified: Secondary | ICD-10-CM

## 2011-11-10 MED ORDER — METHOCARBAMOL 500 MG PO TABS: 500.0000 mg | ORAL_TABLET | Freq: Four times a day (QID) | ORAL | Status: AC | PRN

## 2011-11-10 NOTE — Assessment & Plan Note (Signed)
He has chest wall tenderness to palpation consistent with musculoskeletal pain that has not responded to NSAIDs  Recommendations #1 trial of Robaxin

## 2011-11-10 NOTE — Progress Notes (Signed)
  History of Present Illness:  Mr. Blank returns for evaluation of abdominal pain. Upper endoscopy in May was unrevealing. Colonoscopy in February was also unremarkable. He stopped all his medications including Prilosec and Lipitor. He reports no abdominal pain. His main complaint is chest pain consisting of soreness when he twists and turns.    Review of Systems: Pertinent positive and negative review of systems were noted in the above HPI section. All other review of systems were otherwise negative.    Current Medications, Allergies, Past Medical History, Past Surgical History, Family History and Social History were reviewed in [employer] electronic medical record  Vital signs were reviewed in today's medical record. Physical Exam: General: Well developed , well nourished, no acute distress On palpation of the chest wall there is tenderness anteriorly.

## 2011-11-10 NOTE — Assessment & Plan Note (Signed)
Pain has subsided. There is no evidence for active peptic ulcer disease or other GI pathology.  Recommendations #1 no further GI workup #2 discontinue Prilosec

## 2011-11-10 NOTE — Patient Instructions (Addendum)
Follow up as needed

## 2011-12-06 ENCOUNTER — Telehealth: Payer: Self-pay | Admitting: *Deleted

## 2011-12-06 MED ORDER — OXYCODONE HCL 10 MG PO TABS: 10.0000 mg | ORAL_TABLET | Freq: Four times a day (QID) | ORAL | Status: DC | PRN

## 2011-12-06 NOTE — Telephone Encounter (Signed)
Done hardcopy to robin  

## 2011-12-06 NOTE — Telephone Encounter (Signed)
Called informed the patient prescriptions requested are ready for pickup.

## 2011-12-06 NOTE — Telephone Encounter (Signed)
Patient request refill on Rx oxycodone 10mg  . He is going out of town and wanted to come by and pick up prescriptions for 3 month.

## 2012-02-10 ENCOUNTER — Ambulatory Visit (INDEPENDENT_AMBULATORY_CARE_PROVIDER_SITE_OTHER): Payer: Medicare Other | Admitting: Internal Medicine

## 2012-02-10 ENCOUNTER — Encounter: Payer: Self-pay | Admitting: Internal Medicine

## 2012-02-10 VITALS — BP 112/80 | HR 89 | Temp 98.2°F | Ht 69.0 in | Wt 241.0 lb

## 2012-02-10 DIAGNOSIS — W57XXXA Bitten or stung by nonvenomous insect and other nonvenomous arthropods, initial encounter: Secondary | ICD-10-CM

## 2012-02-10 DIAGNOSIS — G4700 Insomnia, unspecified: Secondary | ICD-10-CM

## 2012-02-10 DIAGNOSIS — I10 Essential (primary) hypertension: Secondary | ICD-10-CM

## 2012-02-10 DIAGNOSIS — J019 Acute sinusitis, unspecified: Secondary | ICD-10-CM | POA: Insufficient documentation

## 2012-02-10 DIAGNOSIS — T148XXA Other injury of unspecified body region, initial encounter: Secondary | ICD-10-CM

## 2012-02-10 MED ORDER — AZITHROMYCIN 250 MG PO TABS: ORAL_TABLET | ORAL | Status: AC

## 2012-02-10 MED ORDER — ALPRAZOLAM 0.5 MG PO TABS: 0.5000 mg | ORAL_TABLET | Freq: Every evening | ORAL | Status: AC | PRN

## 2012-02-10 NOTE — Assessment & Plan Note (Signed)
No skin changes to legs, pt o/w reassured,  to f/u any worsening symptoms or concerns

## 2012-02-10 NOTE — Patient Instructions (Addendum)
Take all new medications as prescribed - the antibiotic, and the xanax at night as needed Continue all other medications as before, except to stop the Klonopin

## 2012-02-10 NOTE — Progress Notes (Signed)
Subjective:    Patient ID: [name], male    DOB: [birth year], 41 y.o.   MRN: [id number]  HPI   Here with 3 days acute onset fever, facial pain, pressure, general weakness and malaise, and greenish d/c, with slight ST, but little to no cough and Pt denies chest pain, increased sob or doe, wheezing, orthopnea, PND, increased LE swelling, palpitations, dizziness or syncope.  Also had onset mult insect bites to legs below the knees as he was walking through a field, very bothersome with numerous itchy red areas but much better today, no real tenderness or pain.  Pt denies new neurological symptoms such as new headache, or facial or extremity weakness or numbness   Pt denies polydipsia, polyuria.  Does also have ongoing problem getting to sleep most nights, a friends xanax helped immensely, with klonopin just not helping.   Past Medical History  Diagnosis Date  . BACK PAIN, CHRONIC 06/17/2007  . 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 HEMORRHOIDS, INTERNAL 06/17/2007  . RECTAL BLEEDING 06/17/2007  . Arthritis   . Hyperlipidemia   . Hypertension   . Lumbar disc disease 03/19/2011  . Depression 04/19/2011   Past Surgical History  Procedure Date  . Back surgery 2006 and 2011    x 2; Dr Leanne Lovely  . Colonoscopy   . Polypectomy   . Tonsillectomy and adenoidectomy     reports that he has been smoking Cigarettes.  He has a 29 pack-year smoking history. He has never used smokeless tobacco. He reports that he drinks alcohol. He reports that he does not use illicit drugs. family history includes Arthritis in his mother; Diabetes in his mother; Heart disease in his mother; and Hypertension in his mother.  There is no history of Colon cancer, and Esophageal cancer, and Stomach cancer, and Rectal cancer, . Allergies  Allergen Reactions  . Tramadol     Sores in mouth  . Valium     Sores in mouth  . Morphine And Related Rash  . Penicillins Rash   Current Outpatient Prescriptions on File Prior to  Visit  Medication Sig Dispense Refill  . atorvastatin (LIPITOR) 20 MG tablet Take 1 tablet (20 mg total) by mouth daily.  90 tablet  3  . Oxycodone HCl 10 MG TABS Take 1 tablet (10 mg total) by mouth 4 (four) times daily as needed. To fill  sept 12, 2013  120 each  0   Review of Systems Review of Systems  Constitutional: Negative for diaphoresis and unexpected weight change.  HENT: Negative for tinnitus.   Eyes: Negative for photophobia and visual disturbance.  Respiratory: Negative for choking and stridor.   Gastrointestinal: Negative for vomiting and blood in stool.  Genitourinary: Negative for hematuria and decreased urine volume.  Musculoskeletal: Negative for gait problem.  Neurological: Negative for tremors and numbness.  Psychiatric/Behavioral: Negative for decreased concentration. The patient is not hyperactive.      Objective:   Physical Exam BP 112/80  Pulse 89  Temp 98.2 F (36.8 C) (Oral)  Ht 5\' 9"  (1.753 m)  Wt 241 lb (109.317 kg)  BMI 35.59 kg/m2  SpO2 97% Physical Exam  VS noted Constitutional: Pt appears well-developed and well-nourished.  HENT: Head: Normocephalic.  Right Ear: External ear normal.  Left Ear: External ear normal.  Bilat tm's mild erythema.  Sinus tender bilat.  Pharynx mild erythema Eyes: Conjunctivae and EOM are normal. Pupils are equal, round, and reactive to light.  Neck: Normal range of motion. Neck  supple.  Cardiovascular: Normal rate and regular rhythm.   Pulmonary/Chest: Effort normal and breath sounds normal. No rales or wheezing Neurological: Pt is alert. Not confused.  Skin: Skin is warm. No erythema. No rash Psychiatric: Pt behavior is normal. Thought content normal. 1+ nervous    Assessment & Plan:

## 2012-02-10 NOTE — Assessment & Plan Note (Signed)
stable overall by hx and exam, most recent data reviewed with pt, and pt to continue medical treatment as before BP Readings from Last 3 Encounters:  02/10/12 112/80  11/10/11 122/74  10/12/11 121/75

## 2012-02-10 NOTE — Assessment & Plan Note (Signed)
Ok to change to xanax qhs prn,  to f/u any worsening symptoms or concerns

## 2012-02-10 NOTE — Assessment & Plan Note (Signed)
Mild to mod, for antibx course,  to f/u any worsening symptoms or concerns 

## 2012-03-07 ENCOUNTER — Ambulatory Visit (INDEPENDENT_AMBULATORY_CARE_PROVIDER_SITE_OTHER): Payer: Medicare Other | Admitting: Internal Medicine

## 2012-03-07 ENCOUNTER — Encounter: Payer: Self-pay | Admitting: Internal Medicine

## 2012-03-07 ENCOUNTER — Other Ambulatory Visit (INDEPENDENT_AMBULATORY_CARE_PROVIDER_SITE_OTHER): Payer: Medicare Other

## 2012-03-07 VITALS — BP 112/70 | HR 75 | Temp 97.6°F | Ht 69.0 in | Wt 241.8 lb

## 2012-03-07 DIAGNOSIS — N320 Bladder-neck obstruction: Secondary | ICD-10-CM

## 2012-03-07 DIAGNOSIS — M549 Dorsalgia, unspecified: Secondary | ICD-10-CM

## 2012-03-07 DIAGNOSIS — I10 Essential (primary) hypertension: Secondary | ICD-10-CM

## 2012-03-07 DIAGNOSIS — E785 Hyperlipidemia, unspecified: Secondary | ICD-10-CM

## 2012-03-07 DIAGNOSIS — G4700 Insomnia, unspecified: Secondary | ICD-10-CM

## 2012-03-07 LAB — BASIC METABOLIC PANEL
GFR: 112.11 mL/min (ref >60.00)
Potassium: 4.5 mEq/L (ref 3.5–5.1)
Sodium: 137 mEq/L (ref 135–145)

## 2012-03-07 LAB — TSH: TSH: 1.68 u[IU]/mL (ref 0.35–5.50)

## 2012-03-07 LAB — CBC WITH DIFFERENTIAL/PLATELET
Basophils Absolute: 0 10*3/uL (ref 0.0–0.1)
HCT: 42.6 % (ref 39.0–52.0)
Lymphs Abs: 2.6 10*3/uL (ref 0.7–4.0)
MCHC: 33.4 g/dL (ref 30.0–36.0)
MCV: 92.3 fl (ref 78.0–100.0)
Monocytes Absolute: 0.4 10*3/uL (ref 0.1–1.0)
Platelets: 267 10*3/uL (ref 150.0–400.0)
RDW: 14.3 % (ref 11.5–14.6)

## 2012-03-07 LAB — HEPATIC FUNCTION PANEL
AST: 15 U/L (ref 0–37)
Alkaline Phosphatase: 78 U/L (ref 39–117)
Total Bilirubin: 0.8 mg/dL (ref 0.3–1.2)

## 2012-03-07 LAB — LIPID PANEL
HDL: 41.4 mg/dL (ref >39.00)
VLDL: 31.6 mg/dL (ref 0.0–40.0)

## 2012-03-07 LAB — LDL CHOLESTEROL, DIRECT: Direct LDL: 182.1 mg/dL

## 2012-03-07 LAB — PSA: PSA: 0.29 ng/mL (ref 0.10–4.00)

## 2012-03-07 MED ORDER — SIMVASTATIN 40 MG PO TABS: 40.0000 mg | ORAL_TABLET | Freq: Every day | ORAL | Status: DC

## 2012-03-07 MED ORDER — OXYCODONE HCL 10 MG PO TABS: 10.0000 mg | ORAL_TABLET | Freq: Four times a day (QID) | ORAL | Status: DC | PRN

## 2012-03-07 NOTE — Patient Instructions (Addendum)
Please make nurse visit if you change your mind about the flu shot OK to stop the Lipitor as you have Please start the simvastatin at 40 mg per day Continue all other medications as before Your Oxycodone prescription is ok to fill slightly early due to your going out of town Please have the pharmacy call with any refills you may need. Please go to LAB in the Basement for the blood and/or urine tests to be done today You will be contacted by phone if any changes need to be made immediately.  Otherwise, you will receive a letter about your results with an explanation. Please remember to sign up for My Chart at your earliest convenience, as this will be important to you in the future with finding out test results. Please return in 6 months, or sooner if needed

## 2012-03-08 ENCOUNTER — Encounter: Payer: Self-pay | Admitting: Internal Medicine

## 2012-03-08 LAB — URINALYSIS, ROUTINE W REFLEX MICROSCOPIC
Bilirubin Urine: NEGATIVE
Ketones, ur: NEGATIVE
Leukocytes, UA: NEGATIVE
Urobilinogen, UA: 0.2 (ref 0.0–1.0)
pH: 6 (ref 5.0–8.0)

## 2012-03-11 NOTE — Assessment & Plan Note (Signed)
stable overall by hx and exam, most recent data reviewed with pt, and pt to continue medical treatment as before BP Readings from Last 3 Encounters:  03/07/12 112/70  02/10/12 112/80  11/10/11 122/74

## 2012-03-11 NOTE — Assessment & Plan Note (Signed)
stable overall by hx and exam, , and pt to continue medical treatment as before - for med refill

## 2012-03-11 NOTE — Assessment & Plan Note (Addendum)
stable overall by hx and exam but needs lipitor changed to zocor due to constipation, most recent data reviewed with pt, and pt to continue medical treatment as before Lab Results  Component Value Date   LDLCALC 107* 04/19/2011

## 2012-03-11 NOTE — Assessment & Plan Note (Signed)
Also due for psa 

## 2012-03-11 NOTE — Progress Notes (Signed)
Subjective:    Patient ID: [name], male    DOB: 1970-10-07, 41 y.o.   MRN: [id number]  HPI  Here to f/u; overall doing ok,  Pt denies chest pain, increased sob or doe, wheezing, orthopnea, PND, increased LE swelling, palpitations, dizziness or syncope.  Pt denies new neurological symptoms such as new headache, or facial or extremity weakness or numbness   Pt denies polydipsia, polyuria,.  Pt states overall good compliance with meds, trying to follow lower cholesterol diet, wt overall stable but little exercise however. Pain overall controlled and usually has not problem with narcotic related constipation, but had to stop the lipitor due to problems when starting taking it.  Did try twice actually.  Sleep - overall much improved, only using the [place] about every other night or so.   Pt denies fever, wt loss, night sweats, loss of appetite, or other constitutional symptoms  Denies worsening depressive symptoms, suicidal ideation, or panic. Past Medical History  Diagnosis Date  . BACK PAIN, CHRONIC 06/17/2007  . 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 HEMORRHOIDS, INTERNAL 06/17/2007  . RECTAL BLEEDING 06/17/2007  . Arthritis   . Hyperlipidemia   . Hypertension   . Lumbar disc disease 03/19/2011  . Depression 04/19/2011   Past Surgical History  Procedure Date  . Back surgery 2006 and 2011    x 2; Dr Leanne Lovely  . Colonoscopy   . Polypectomy   . Tonsillectomy and adenoidectomy     reports that he has been smoking Cigarettes.  He has a 29 pack-year smoking history. He has never used smokeless tobacco. He reports that he drinks alcohol. He reports that he does not use illicit drugs. family history includes Arthritis in his mother; Diabetes in his mother; Heart disease in his mother; and Hypertension in his mother.  There is no history of Colon cancer, and Esophageal cancer, and Stomach cancer, and Rectal cancer, . Allergies  Allergen Reactions  . Lipitor (Atorvastatin) Other (See  Comments)    constipation  . Tramadol     Sores in mouth  . Valium     Sores in mouth  . Morphine And Related Rash  . Penicillins Rash   Current Outpatient Prescriptions on File Prior to Visit  Medication Sig Dispense Refill  . ALPRAZolam (XANAX) 0.5 MG tablet Take 1 tablet (0.5 mg total) by mouth at bedtime as needed for sleep.  30 tablet  2  . simvastatin (ZOCOR) 40 MG tablet Take 1 tablet (40 mg total) by mouth at bedtime.  90 tablet  3   Review of Systems  Constitutional: Negative for diaphoresis and unexpected weight change.  HENT: Negative for tinnitus.   Eyes: Negative for photophobia and visual disturbance.  Respiratory: Negative for choking and stridor.   Gastrointestinal: Negative for vomiting and blood in stool.  Genitourinary: Negative for hematuria and decreased urine volume.  Musculoskeletal: Negative for gait problem.  Skin: Negative for color change and wound.  Neurological: Negative for tremors and numbness.  Psychiatric/Behavioral: Negative for decreased concentration. The patient is not hyperactive.       Objective:   Physical Exam BP 112/70  Pulse 75  Temp 97.6 F (36.4 C) (Oral)  Ht 5\' 9"  (1.753 m)  Wt 241 lb 12 oz (109.657 kg)  BMI 35.70 kg/m2  SpO2 97% Physical Exam  VS noted Constitutional: Pt appears well-developed and well-nourished.  HENT: Head: Normocephalic.  Right Ear: External ear normal.  Left Ear: External ear normal.  Eyes: Conjunctivae and EOM are  normal. Pupils are equal, round, and reactive to light.  Neck: Normal range of motion. Neck supple.  Cardiovascular: Normal rate and regular rhythm.   Pulmonary/Chest: Effort normal and breath sounds normal.  Abd:  Soft, NT, non-distended, + BS Neurological: Pt is alert. Not confused , spine nontender Skin: Skin is warm. No erythema.  Psychiatric: Pt behavior is normal. Thought content normal.     Assessment & Plan:

## 2012-03-11 NOTE — Assessment & Plan Note (Signed)
Improved,  to f/u any worsening symptoms or concerns 

## 2012-03-27 ENCOUNTER — Other Ambulatory Visit: Payer: Self-pay | Admitting: *Deleted

## 2012-03-27 MED ORDER — OXYCODONE HCL 10 MG PO TABS: 10.0000 mg | ORAL_TABLET | Freq: Four times a day (QID) | ORAL | Status: DC | PRN

## 2012-03-27 NOTE — Telephone Encounter (Signed)
Called informed the patient to pickup hardcopy of prescriptions at the front desk.

## 2012-03-27 NOTE — Telephone Encounter (Signed)
Pt is requesting refill of Oxycodone for November and December-he wants to know if he can pick them up today since he is in [place].

## 2012-03-27 NOTE — Telephone Encounter (Signed)
Done hardcopy to robin  

## 2012-03-29 ENCOUNTER — Telehealth: Payer: Self-pay | Admitting: Internal Medicine

## 2012-03-29 NOTE — Telephone Encounter (Signed)
Caller: Dayna/Patient; Patient Name: [name]; PCP: Oliver Barre (Adults only); Best Callback Phone Number: [phone].  Pt reports pain at top of stomach that began this am (10/30) pt states it is worse when pushing on it. Pt does report diarrhea, no vomiting.  Pt rates pain as 9/10.  Emergent symptom of 'Unbearable addominal/pelvic pain' identified in the Abdominal Pain Protocol.  RN advised for pt to be seen in the ED.  Pt verbalized understanding and states he will go to [facility].  Pt had driver.

## 2012-03-29 NOTE — Telephone Encounter (Signed)
noted 

## 2012-03-30 ENCOUNTER — Encounter: Payer: Self-pay | Admitting: Physician Assistant

## 2012-03-30 ENCOUNTER — Ambulatory Visit (INDEPENDENT_AMBULATORY_CARE_PROVIDER_SITE_OTHER): Payer: Medicare Other | Admitting: Physician Assistant

## 2012-03-30 ENCOUNTER — Telehealth: Payer: Self-pay | Admitting: Gastroenterology

## 2012-03-30 VITALS — BP 118/60 | HR 84 | Ht 69.0 in | Wt 240.6 lb

## 2012-03-30 DIAGNOSIS — R52 Pain, unspecified: Secondary | ICD-10-CM

## 2012-03-30 DIAGNOSIS — R1013 Epigastric pain: Secondary | ICD-10-CM

## 2012-03-30 DIAGNOSIS — M940 Chondrocostal junction syndrome [Tietze]: Secondary | ICD-10-CM

## 2012-03-30 MED ORDER — NAPROXEN 500 MG PO TABS: ORAL_TABLET | ORAL | Status: DC

## 2012-03-30 MED ORDER — OMEPRAZOLE 20 MG PO CPDR: 20.0000 mg | DELAYED_RELEASE_CAPSULE | Freq: Every day | ORAL | Status: DC

## 2012-03-30 NOTE — Telephone Encounter (Signed)
Pt states he is having abdominal pain all across his abdomen above his belly button. Pt was seen in the ER at [facility] last night and told to follow-up with GI. Pt was c/o bad abdominal pain. States he has a disc from [facility] he will be bringing to the office. Pt scheduled to see Mike Gip PA today at 1:30pm. Pt aware of appt date and time.

## 2012-03-30 NOTE — Progress Notes (Signed)
Subjective:    Patient ID: [name], male    DOB: 1971-01-01, 41 y.o.   MRN: [id number]  HPI [name] is a 41 year old white male known to Dr. Arlyce Quinn, primary patient of Dr. Jonny Quinn. He had undergone workup earlier this year for complaints of epigastric pain. He had upper endoscopy done in May of 2013 which was a normal exam. He also had colonoscopy in February 2013 which was normal with the exception of internal hemorrhoids. He does have history of an adenomatous polyp in 2008 .He also has hypertension and hyperlipidemia. With back pain and has undergone prior lumbar surgery. He is on chronic oxycodone for management of his back pain. Patient comes in today after an emergency room visit last night at [facility] for acute abdominal pain. He says he had onset of epigastric pain on Tuesday 1029 and that this is similar to his pain that he had earlier this year. Is in the interval he had been feeling fine. He describes the pain as being located across his upper abdomen and sharp in nature there is no radiation to his chest or his back. He denies any nausea or vomiting, no fever or chills, no changes in his bowel habits. He does not notice any change in his pain with eating. He does not feel that the pain is positional. He has not been on any new medications, denies any NSAID use on and has not had any recent injury. Fall. heavy lifting etc. He says in the ER at [facility] he had an upper gone ultrasound done and was told this was normal. We are waiting for those records. He also states he had labs done which he was told were normal. He was asked to followup with his gastroenterologist  His pain today is about the same, and his oxicodone is not helpful.    Review of Systems  Constitutional: Negative.   HENT: Negative.   Eyes: Negative.   Respiratory: Negative.   Cardiovascular: Negative.   Gastrointestinal: Positive for abdominal pain.  Genitourinary: Negative.     Musculoskeletal: Positive for back pain.  Skin: Negative.   Neurological: Negative.   Hematological: Negative.   Psychiatric/Behavioral: Negative.    Outpatient Prescriptions Prior to Visit  Medication Sig Dispense Refill  . Oxycodone HCl 10 MG TABS Take 1 tablet (10 mg total) by mouth 4 (four) times daily as needed. To fill  May 07, 2012  120 each  0  . simvastatin (ZOCOR) 40 MG tablet Take 1 tablet (40 mg total) by mouth at bedtime.  90 tablet  3       Allergies  Allergen Reactions  . Lipitor (Atorvastatin) Other (See Comments)    constipation  . Tramadol     Sores in mouth  . Valium     Sores in mouth  . Morphine And Related Rash  . Penicillins Rash   Patient Active Problem List  Diagnosis  . COLONIC POLYPS, ADENOMATOUS  . HEMORRHOIDS, INTERNAL  . RECTAL BLEEDING  . BACK PAIN, CHRONIC  . Preventative health care  . Arthritis  . Hyperlipidemia  . Hypertension  . Lumbar disc disease  . Fatigue  . Bladder neck obstruction  . Smoker  . Depression  . Insomnia  . Encounter for long-term (current) use of high-risk medication  . Anxiety  . GERD (gastroesophageal reflux disease)  . Abdominal pain, epigastric   History  Substance Use Topics  . Smoking status: Current Every Day Smoker -- 1.0 packs/day for  29 years    Types: Cigarettes  . Smokeless tobacco: Former Neurosurgeon   Comment: Counseling sheet given in exam room   . Alcohol Use: Yes     social--twelve pack monthly     Objective:   Physical Exam well-developed white male in no acute distress, blood pressure 118/60 pulse 84 height 5 foot 9 weight 240. HEENT; nontraumatic normocephalic EOMI PERRLA sclera anicteric, Cardiovascular; regular rate and rhythm with S1-S2 no murmur or gallop, Pulmonary; clear bilaterally, Abdomen; large soft nontender nondistended bowel sounds are active there is no palpable mass or hepatosplenomegaly. He is very tender to palpation over the costal margins bilaterally and over the xiphoid  process and lower anterior ribs bilaterally. Rectal; not done, Extremities; no clubbing, cyanosis or edema skin warm and dry, Psych; mood and affect normal and appropriate.        Assessment & Plan:  #106 41 year old male with recurrent acute complaint of upper abdominal  Pain which on exam is actually more consistent with costal margin pain and likely secondary to costochondritis. Patient had negative labs and ultrasound done yesterday, and EGD was negative in may of 2013 for this same pain. #2 history of adenomatous colon polyps 2008 , negative colonoscopy 2013 #3 chronic narcotic use for back pain  Plan; start naproxen 500 mg by mouth twice daily with food x2 weeks Start Prilosec 20 mg by mouth every morning x2-3 weeks Advised moist heat to anterior chest wall Patient was advised that costochondritis can take several weeks to resolve. He will followup with Dr. Arlyce Quinn in about 4 weeks. We'll review the ER records from [facility]   Addendum; labs 03/29/2012 WBC of 9.5 hemoglobin 13.9 hematocrit 40.3 MCV of 91 platelets 266 electrolytes within normal limits her function studies normal lipase 17, UA negative.  Chest x-ray negative.   Ultrasound; coarse echo texture of the liver ,no gallstones or gallbladder wall thickening. There is no mention of the pancreas on  The  printed report

## 2012-03-30 NOTE — Patient Instructions (Addendum)
We have sent the following medication to your pharmacy for you to pick up at your convenience: Naproxen. Please take as directed  We have given you samples of the following medication to take: Prilosec. Please take one tablet by mouth 30 minutes before breakfast daily  You have been scheduled a follow up visit with Dr. Arlyce Dice on 04-26-2012 at 11 am  Moist Heat (Warm Packs) Moist heat will help your injured area heal and feel better. HOME CARE INSTRUCTIONS   Wet a clean towel in warm water, and squeeze out the extra water. Use warm, not hot water!   Put the warm, wet towels on the affected area and cover with a waterproof cover. Lay a dry towel over this cover.   Leave the pack on for 20 minutes.   Repeat this every 4 to 6 hours while awake.  SEEK MEDICAL CARE IF:  You develop blisters or redness on the affected area.   You losefeeling or have numbness in the affected area.   You have swelling on the affected area.   You have any new or severe problems.  Stop using the warm packs if you have any of the above problems. MAKE SURE YOU:   Understand these instructions.   Will watch your condition.   Will get help right away if you are not doing well or get worse.  Document Released: 04/22/2004 Document Revised: 05/06/2011 Document Reviewed: 11/15/2005 [facility] Patient Information 2012 [place], [place].

## 2012-04-03 NOTE — Progress Notes (Signed)
Reviewed and agree with management. Camdyn Beske D. Kendal Raffo, M.D., FACG  

## 2012-04-26 ENCOUNTER — Ambulatory Visit: Payer: Medicare Other | Admitting: Gastroenterology

## 2012-05-03 ENCOUNTER — Other Ambulatory Visit: Payer: Self-pay | Admitting: Internal Medicine

## 2012-05-03 NOTE — Telephone Encounter (Signed)
Faxed hardcopy to pharmacy. 

## 2012-05-03 NOTE — Telephone Encounter (Signed)
Done hardcopy to robin  

## 2012-05-17 ENCOUNTER — Other Ambulatory Visit: Payer: Self-pay | Admitting: *Deleted

## 2012-05-17 MED ORDER — OXYCODONE HCL 10 MG PO TABS: 10.0000 mg | ORAL_TABLET | Freq: Four times a day (QID) | ORAL | Status: DC | PRN

## 2012-05-17 NOTE — Telephone Encounter (Signed)
Called the patient left detailed message that prescriptions requested are ready for pickup at the front desk.

## 2012-05-17 NOTE — Telephone Encounter (Signed)
Done hardcopy to robin  

## 2012-05-17 NOTE — Telephone Encounter (Signed)
Pt requesting 3 month's supply of Oxycodone 10mg . Pt will be in [place] today and would like to pick prescriptions up if possible.

## 2012-06-12 ENCOUNTER — Ambulatory Visit (INDEPENDENT_AMBULATORY_CARE_PROVIDER_SITE_OTHER): Payer: Medicare Other | Admitting: Internal Medicine

## 2012-06-12 ENCOUNTER — Encounter: Payer: Self-pay | Admitting: Internal Medicine

## 2012-06-12 ENCOUNTER — Ambulatory Visit: Payer: Medicare Other | Admitting: Internal Medicine

## 2012-06-12 VITALS — BP 100/62 | HR 96 | Temp 98.4°F | Ht 69.0 in | Wt 231.2 lb

## 2012-06-12 DIAGNOSIS — F411 Generalized anxiety disorder: Secondary | ICD-10-CM

## 2012-06-12 DIAGNOSIS — F419 Anxiety disorder, unspecified: Secondary | ICD-10-CM

## 2012-06-12 DIAGNOSIS — I10 Essential (primary) hypertension: Secondary | ICD-10-CM

## 2012-06-12 DIAGNOSIS — M549 Dorsalgia, unspecified: Secondary | ICD-10-CM

## 2012-06-12 DIAGNOSIS — J40 Bronchitis, not specified as acute or chronic: Secondary | ICD-10-CM | POA: Insufficient documentation

## 2012-06-12 MED ORDER — NABUMETONE 500 MG PO TABS: 500.0000 mg | ORAL_TABLET | Freq: Two times a day (BID) | ORAL | Status: DC | PRN

## 2012-06-12 MED ORDER — AZITHROMYCIN 250 MG PO TABS: ORAL_TABLET | ORAL | Status: DC

## 2012-06-12 NOTE — Patient Instructions (Addendum)
Please take all new medication as prescribed - the antibiotic, and anti-inflammatory for pain Please continue all other medications as before Please drink more fluids in the next few days Please continue your efforts at being more active, low cholesterol diet, and weight control as you are doing

## 2012-06-12 NOTE — Assessment & Plan Note (Signed)
stable overall by history and exam, recent data reviewed with pt, and pt to continue medical treatment as before,  to f/u any worsening symptoms or concerns Lab Results  Component Value Date   WBC 9.5 03/07/2012   HGB 14.2 03/07/2012   HCT 42.6 03/07/2012   PLT 267.0 03/07/2012   GLUCOSE 87 03/07/2012   CHOL 254* 03/07/2012   TRIG 158.0* 03/07/2012   HDL 41.40 03/07/2012   LDLDIRECT 182.1 03/07/2012   LDLCALC 107* 04/19/2011   ALT 15 03/07/2012   AST 15 03/07/2012   NA 137 03/07/2012   K 4.5 03/07/2012   CL 101 03/07/2012   CREATININE 0.8 03/07/2012   BUN 13 03/07/2012   CO2 29 03/07/2012   TSH 1.68 03/07/2012   PSA 0.29 03/07/2012   INR 0.98 09/29/2009

## 2012-06-12 NOTE — Assessment & Plan Note (Signed)
Ok for relafen prn for pain/myalgias assoc with acute illness, I declined to incr oxycodone today,  to f/u any worsening symptoms or concerns

## 2012-06-12 NOTE — Progress Notes (Signed)
Subjective:    Patient ID: [name], male    DOB: [birth year], 42 y.o.   MRN: [id number]  HPI  Here with acute onset mild to mod 2-3 days ST, HA, general weakness and malaise, with prod cough greenish sputum, but Pt denies chest pain, increased sob or doe, wheezing, orthopnea, PND, increased LE swelling, palpitations, dizziness or syncope.   Pt denies polydipsia, polyuria. Pt denies new neurological symptoms such as new headache, or facial or extremity weakness or numbness  Denies worsening depressive symptoms, suicidal ideation, or panic; has ongoing anxiety.  Pt continues to have recurring LBP without change in severity, bowel or bladder change, fever, wt loss,  worsening LE pain/numbness/weakness, gait change or falls, oxycodone not working as well now. Past Medical History  Diagnosis Date  . BACK PAIN, CHRONIC 06/17/2007  . 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 HEMORRHOIDS, INTERNAL 06/17/2007  . RECTAL BLEEDING 06/17/2007  . Arthritis   . Hyperlipidemia   . Hypertension   . Lumbar disc disease 03/19/2011  . Depression 04/19/2011   Past Surgical History  Procedure Date  . Back surgery 2006 and 2011    x 2; Dr Leanne Lovely  . Colonoscopy   . Polypectomy   . Tonsillectomy and adenoidectomy     reports that he has been smoking Cigarettes.  He has a 29 pack-year smoking history. He has quit using smokeless tobacco. He reports that he drinks alcohol. He reports that he does not use illicit drugs. family history includes Arthritis in his mother; Diabetes in his mother; Heart disease in his mother; and Hypertension in his mother.  There is no history of Colon cancer, and Esophageal cancer, and Stomach cancer, and Rectal cancer, . Allergies  Allergen Reactions  . Lipitor (Atorvastatin) Other (See Comments)    constipation  . Tramadol     Sores in mouth  . Valium     Sores in mouth  . Morphine And Related Rash  . Penicillins Rash   Current Outpatient Prescriptions on File Prior to  Visit  Medication Sig Dispense Refill  . ALPRAZolam (XANAX) 0.5 MG tablet TAKE ONE TABLET BY MOUTH AT BEDTIME AS NEEDED FOR SLEEP  30 tablet  2  . naproxen (NAPROSYN) 500 MG tablet Please take one pill by mouth twice daily with food for ten days then as needed  30 tablet  1  . omeprazole (PRILOSEC) 20 MG capsule Take 1 capsule (20 mg total) by mouth daily with breakfast.  20 capsule  0  . Oxycodone HCl 10 MG TABS Take 1 tablet (10 mg total) by mouth 4 (four) times daily as needed. To fill  Aug 05, 2012  120 each  0  . simvastatin (ZOCOR) 40 MG tablet Take 1 tablet (40 mg total) by mouth at bedtime.  90 tablet  3   Review of Systems  Constitutional: Negative for unexpected weight change, or unusual diaphoresis  HENT: Negative for tinnitus.   Eyes: Negative for photophobia and visual disturbance.  Respiratory: Negative for choking and stridor.   Gastrointestinal: Negative for vomiting and blood in stool.  Genitourinary: Negative for hematuria and decreased urine volume.  Musculoskeletal: Negative for acute joint swelling Skin: Negative for color change and wound.  Neurological: Negative for tremors and numbness other than noted  Psychiatric/Behavioral: Negative for decreased concentration or  hyperactivity.       Objective:   Physical Exam BP 100/62  Pulse 96  Temp 98.4 F (36.9 C) (Oral)  Ht 5\' 9"  (1.753 m)  Wt 231 lb 3 oz (104.866 kg)  BMI 34.14 kg/m2  SpO2 94% VS noted, mild ill Constitutional: Pt appears well-developed and well-nourished.  HENT: Head: NCAT.  Right Ear: External ear normal.  Left Ear: External ear normal. Bilat tm's with mild erythema.  Max sinus areas mild tender.  Pharynx with mild erythema, no exudate  Eyes: Conjunctivae and EOM are normal. Pupils are equal, round, and reactive to light.  Neck: Normal range of motion. Neck supple.  Cardiovascular: Normal rate and regular rhythm.   Pulmonary/Chest: Effort normal and breath sounds mild decresased, no wheezes    Neurological: Pt is alert. Not confused  Skin: Skin is warm. No erythema.  Psychiatric: Pt behavior is normal. Thought content normal. not depressed affect, 1-2+ nervous    Assessment & Plan:

## 2012-06-12 NOTE — Assessment & Plan Note (Signed)
?   Flu like vs other, for trial antibx, relafen for pain prn,  to f/u any worsening symptoms or concerns

## 2012-06-12 NOTE — Assessment & Plan Note (Signed)
Mild decreased today liekly related to lower po intake, but stable overall by history and exam, recent data reviewed with pt, and pt to continue medical treatment as before,  to f/u any worsening symptoms or concerns BP Readings from Last 3 Encounters:  06/12/12 100/62  03/30/12 118/60  03/07/12 112/70

## 2012-07-12 ENCOUNTER — Encounter: Payer: Self-pay | Admitting: Internal Medicine

## 2012-07-17 ENCOUNTER — Telehealth: Payer: Self-pay | Admitting: Internal Medicine

## 2012-07-17 NOTE — Telephone Encounter (Signed)
Dismissal Letter sent by Certified Mail 07/18/2012  Received the Return Receipt showing someone picked up the Dismissal Letter 07/20/2012

## 2012-07-31 ENCOUNTER — Other Ambulatory Visit: Payer: Self-pay | Admitting: Internal Medicine

## 2012-07-31 NOTE — Telephone Encounter (Signed)
Pt dismissed from practice

## 2012-09-06 ENCOUNTER — Ambulatory Visit: Payer: Medicare Other | Admitting: Internal Medicine

## 2013-04-02 ENCOUNTER — Other Ambulatory Visit: Payer: Self-pay | Admitting: Specialist

## 2013-04-02 DIAGNOSIS — M549 Dorsalgia, unspecified: Secondary | ICD-10-CM

## 2013-04-05 ENCOUNTER — Inpatient Hospital Stay
Admission: RE | Admit: 2013-04-05 | Discharge: 2013-04-05 | Disposition: A | Discharge status: Home / Self Care | Payer: Medicare Other | Source: Ambulatory Visit | Attending: Specialist | Admitting: Specialist

## 2013-04-05 ENCOUNTER — Other Ambulatory Visit: Payer: Medicare Other

## 2013-04-10 ENCOUNTER — Other Ambulatory Visit: Payer: Self-pay | Admitting: Physical Medicine & Rehabilitation

## 2013-04-11 ENCOUNTER — Inpatient Hospital Stay
Admission: RE | Admit: 2013-04-11 | Discharge: 2013-04-11 | Disposition: A | Discharge status: Home / Self Care | Payer: Medicare Other | Source: Ambulatory Visit | Attending: Specialist | Admitting: Specialist

## 2013-04-11 ENCOUNTER — Other Ambulatory Visit: Payer: Medicare Other

## 2013-12-26 ENCOUNTER — Other Ambulatory Visit: Payer: Self-pay | Admitting: Neurological Surgery

## 2013-12-26 DIAGNOSIS — M5416 Radiculopathy, lumbar region: Secondary | ICD-10-CM

## 2013-12-27 ENCOUNTER — Ambulatory Visit
Admission: RE | Admit: 2013-12-27 | Discharge: 2013-12-27 | Disposition: A | Discharge status: Home / Self Care | Payer: Medicare Other | Source: Ambulatory Visit | Attending: Neurological Surgery | Admitting: Neurological Surgery

## 2013-12-27 ENCOUNTER — Emergency Department (HOSPITAL_COMMUNITY): Payer: Medicare Other

## 2013-12-27 ENCOUNTER — Encounter (HOSPITAL_COMMUNITY): Payer: Self-pay | Admitting: Emergency Medicine

## 2013-12-27 ENCOUNTER — Emergency Department (HOSPITAL_COMMUNITY)
Admission: EM | Admit: 2013-12-27 | Discharge: 2013-12-27 | Disposition: A | Discharge status: Home / Self Care | Payer: Medicare Other | Attending: Emergency Medicine | Admitting: Emergency Medicine

## 2013-12-27 DIAGNOSIS — T782XXA Anaphylactic shock, unspecified, initial encounter: Secondary | ICD-10-CM | POA: Insufficient documentation

## 2013-12-27 DIAGNOSIS — F329 Major depressive disorder, single episode, unspecified: Secondary | ICD-10-CM | POA: Diagnosis not present

## 2013-12-27 DIAGNOSIS — G8929 Other chronic pain: Secondary | ICD-10-CM | POA: Diagnosis not present

## 2013-12-27 DIAGNOSIS — Z79899 Other long term (current) drug therapy: Secondary | ICD-10-CM | POA: Diagnosis not present

## 2013-12-27 DIAGNOSIS — M545 Low back pain, unspecified: Secondary | ICD-10-CM | POA: Insufficient documentation

## 2013-12-27 DIAGNOSIS — I10 Essential (primary) hypertension: Secondary | ICD-10-CM | POA: Insufficient documentation

## 2013-12-27 DIAGNOSIS — Z860100 Personal history of colon polyps, unspecified: Secondary | ICD-10-CM | POA: Insufficient documentation

## 2013-12-27 DIAGNOSIS — Z77098 Contact with and (suspected) exposure to other hazardous, chiefly nonmedicinal, chemicals: Secondary | ICD-10-CM | POA: Diagnosis not present

## 2013-12-27 DIAGNOSIS — F3289 Other specified depressive episodes: Secondary | ICD-10-CM | POA: Insufficient documentation

## 2013-12-27 DIAGNOSIS — Z888 Allergy status to other drugs, medicaments and biological substances status: Secondary | ICD-10-CM | POA: Insufficient documentation

## 2013-12-27 DIAGNOSIS — Z8719 Personal history of other diseases of the digestive system: Secondary | ICD-10-CM | POA: Insufficient documentation

## 2013-12-27 DIAGNOSIS — Z880 Allergy status to penicillin: Secondary | ICD-10-CM | POA: Insufficient documentation

## 2013-12-27 DIAGNOSIS — F17200 Nicotine dependence, unspecified, uncomplicated: Secondary | ICD-10-CM | POA: Insufficient documentation

## 2013-12-27 DIAGNOSIS — M5416 Radiculopathy, lumbar region: Secondary | ICD-10-CM

## 2013-12-27 DIAGNOSIS — Z8639 Personal history of other endocrine, nutritional and metabolic disease: Secondary | ICD-10-CM | POA: Insufficient documentation

## 2013-12-27 DIAGNOSIS — T50995A Adverse effect of other drugs, medicaments and biological substances, initial encounter: Secondary | ICD-10-CM | POA: Insufficient documentation

## 2013-12-27 DIAGNOSIS — Z862 Personal history of diseases of the blood and blood-forming organs and certain disorders involving the immune mechanism: Secondary | ICD-10-CM | POA: Diagnosis not present

## 2013-12-27 DIAGNOSIS — Z8739 Personal history of other diseases of the musculoskeletal system and connective tissue: Secondary | ICD-10-CM | POA: Insufficient documentation

## 2013-12-27 LAB — CBC WITH DIFFERENTIAL/PLATELET
BASOS PCT: 0 % (ref 0–1)
Basophils Absolute: 0 10*3/uL (ref 0.0–0.1)
EOS ABS: 0 10*3/uL (ref 0.0–0.7)
Eosinophils Relative: 0 % (ref 0–5)
HCT: 49.2 % (ref 39.0–52.0)
HEMOGLOBIN: 16.8 g/dL (ref 13.0–17.0)
Lymphocytes Relative: 7 % — ABNORMAL LOW (ref 12–46)
Lymphs Abs: 1.4 10*3/uL (ref 0.7–4.0)
MCH: 31.6 pg (ref 26.0–34.0)
MCHC: 34.1 g/dL (ref 30.0–36.0)
MCV: 92.5 fL (ref 78.0–100.0)
MONOS PCT: 5 % (ref 3–12)
Monocytes Absolute: 1.1 10*3/uL — ABNORMAL HIGH (ref 0.1–1.0)
Neutro Abs: 19.5 10*3/uL — ABNORMAL HIGH (ref 1.7–7.7)
Neutrophils Relative %: 88 % — ABNORMAL HIGH (ref 43–77)
PLATELETS: 286 10*3/uL (ref 150–400)
RBC: 5.32 MIL/uL (ref 4.22–5.81)
RDW: 13.9 % (ref 11.5–15.5)
WBC: 22.1 10*3/uL — ABNORMAL HIGH (ref 4.0–10.5)

## 2013-12-27 LAB — BASIC METABOLIC PANEL
ANION GAP: 19 — AB (ref 5–15)
BUN: 12 mg/dL (ref 6–23)
CO2: 21 mEq/L (ref 19–32)
Calcium: 9.2 mg/dL (ref 8.4–10.5)
Chloride: 100 mEq/L (ref 96–112)
Creatinine, Ser: 0.91 mg/dL (ref 0.50–1.35)
GFR calc Af Amer: >90 mL/min (ref >90)
Glucose, Bld: 166 mg/dL — ABNORMAL HIGH (ref 70–99)
Potassium: 3.9 mEq/L (ref 3.7–5.3)
Sodium: 140 mEq/L (ref 137–147)

## 2013-12-27 LAB — I-STAT TROPONIN, ED: Troponin i, poc: 0.01 ng/mL (ref 0.00–0.08)

## 2013-12-27 MED ORDER — DIPHENHYDRAMINE HCL 25 MG PO TABS: 25.0000 mg | ORAL_TABLET | Freq: Four times a day (QID) | ORAL | Status: DC | PRN

## 2013-12-27 MED ORDER — PREDNISONE 20 MG PO TABS: 60.0000 mg | ORAL_TABLET | Freq: Every day | ORAL | Status: DC

## 2013-12-27 MED ORDER — SODIUM CHLORIDE 0.9 % IV BOLUS (SEPSIS)
1000.0000 mL | Freq: Once | INTRAVENOUS | Status: AC
  Administered 2013-12-27: 1000 mL via INTRAVENOUS

## 2013-12-27 MED ORDER — RANITIDINE HCL 150 MG PO TABS: 150.0000 mg | ORAL_TABLET | Freq: Two times a day (BID) | ORAL | Status: DC

## 2013-12-27 MED ORDER — GADOBENATE DIMEGLUMINE 529 MG/ML IV SOLN
20.0000 mL | Freq: Once | INTRAVENOUS | Status: AC | PRN
  Administered 2013-12-27: 20 mL via INTRAVENOUS

## 2013-12-27 MED ORDER — OXYCODONE-ACETAMINOPHEN 5-325 MG PO TABS
2.0000 | ORAL_TABLET | Freq: Once | ORAL | Status: AC
  Administered 2013-12-27: 2 via ORAL
  Filled 2013-12-27: qty 2

## 2013-12-27 MED ORDER — KETOROLAC TROMETHAMINE 30 MG/ML IJ SOLN
30.0000 mg | Freq: Once | INTRAMUSCULAR | Status: AC
Start: 2013-12-27 — End: 2013-12-27
  Administered 2013-12-27: 30 mg via INTRAVENOUS
  Filled 2013-12-27: qty 1

## 2013-12-27 NOTE — ED Notes (Signed)
Pt requesting to go home, reports feeling better now.  Dr.Docherty aware

## 2013-12-27 NOTE — Discharge Instructions (Signed)

## 2013-12-27 NOTE — ED Provider Notes (Signed)
CSN: [id number]     Arrival date & time 12/27/13  1113 History   First MD Initiated Contact with Patient 12/27/13 1219     Chief Complaint  Patient presents with  . Allergic Reaction     (Consider location/radiation/quality/duration/timing/severity/associated sxs/prior Treatment) HPI Comments: Pt started coughing after IV contrast given, became diaphoretic, and unresponsive to staff. Benadryl, Zantac, and 1mg  of 1:1 epi given at [facility]. EMS gave albuterol treatment and solumedrol enroute. Pt alert and oriented to self on arrival  Patient is a 43 y.o. male presenting with allergic reaction. The history is provided by the patient. No language interpreter was used.  Allergic Reaction Presenting symptoms: no difficulty swallowing and no rash   Presenting symptoms comment:  Coughing, diaphoresis, unresponsiveness  Severity:  Severe Prior allergic episodes:  No prior episodes Context comment:  IVC for MRI lumbar spine Relieved by: epi, benadryl, zantac, albuterol and solumedrol given. Ineffective treatments:  None tried   Past Medical History  Diagnosis Date  . BACK PAIN, CHRONIC 06/17/2007  . 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 HEMORRHOIDS, INTERNAL 06/17/2007  . RECTAL BLEEDING 06/17/2007  . Arthritis   . Hyperlipidemia   . Hypertension   . Lumbar disc disease 03/19/2011  . Depression 04/19/2011   Past Surgical History  Procedure Laterality Date  . Back surgery  2006 and 2011    x 2; Dr Hassel Neth  . Colonoscopy    . Polypectomy    . Tonsillectomy and adenoidectomy     Family History  Problem Relation Age of Onset  . Arthritis Mother   . Heart disease Mother   . Hypertension Mother   . Diabetes Mother   . Colon cancer Neg Hx   . Esophageal cancer Neg Hx   . Stomach cancer Neg Hx   . Rectal cancer Neg Hx    History  Substance Use Topics  . Smoking status: Current Every Day Smoker -- 1.00 packs/day for 29 years    Types: Cigarettes  . Smokeless  tobacco: Former Systems developer     Comment: Counseling sheet given in exam room   . Alcohol Use: Yes     Comment: social--twelve pack monthly     Review of Systems  Constitutional: Negative for fever, activity change, appetite change and fatigue.  HENT: Negative for congestion, facial swelling, rhinorrhea and trouble swallowing.   Eyes: Negative for photophobia and pain.  Respiratory: Negative for cough, chest tightness and shortness of breath.   Cardiovascular: Negative for chest pain and leg swelling.  Gastrointestinal: Negative for nausea, vomiting, abdominal pain, diarrhea and constipation.  Endocrine: Negative for polydipsia and polyuria.  Genitourinary: Negative for dysuria, urgency, decreased urine volume and difficulty urinating.  Musculoskeletal: Positive for back pain. Negative for gait problem.  Skin: Negative for color change, rash and wound.  Allergic/Immunologic: Negative for immunocompromised state.  Neurological: Negative for dizziness, facial asymmetry, speech difficulty, weakness, numbness and headaches.  Psychiatric/Behavioral: Negative for confusion, decreased concentration and agitation.      Allergies  Gadolinium derivatives; Lipitor; Tramadol; Valium; Morphine and related; and Penicillins  Home Medications   Prior to Admission medications   Medication Sig Start Date End Date Taking? Authorizing Provider  ALPRAZolam Duanne Moron) 0.5 MG tablet Take 0.5 mg by mouth at bedtime as needed for anxiety or sleep.   Yes Historical Provider, MD  hydrochlorothiazide (MICROZIDE) 12.5 MG capsule Take 12.5 mg by mouth daily.   Yes Historical Provider, MD  Oxycodone HCl 10 MG TABS Take 10 mg by mouth 4 (  four) times daily as needed (pain).   Yes Historical Provider, MD  diphenhydrAMINE (BENADRYL) 25 MG tablet Take 1 tablet (25 mg total) by mouth every 6 (six) hours as needed. 12/27/13   Neta Ehlers, MD  predniSONE (DELTASONE) 20 MG tablet Take 3 tablets (60 mg total) by mouth daily.  12/27/13   Neta Ehlers, MD  ranitidine (ZANTAC) 150 MG tablet Take 1 tablet (150 mg total) by mouth 2 (two) times daily. 12/27/13   Neta Ehlers, MD   BP 122/81  Pulse 91  Temp(Src) 98 F (36.7 C) (Oral)  Resp 16  SpO2 100% Physical Exam  Constitutional: He is oriented to person, place, and time. He appears well-developed and well-nourished. No distress.  Falls asleep while speaking  HENT:  Head: Normocephalic and atraumatic.  Mouth/Throat: No oropharyngeal exudate.  Eyes: Pupils are equal, round, and reactive to light.  Neck: Normal range of motion. Neck supple.  Cardiovascular: Normal rate, regular rhythm and normal heart sounds.  Exam reveals no gallop and no friction rub.   No murmur heard. Pulmonary/Chest: Effort normal and breath sounds normal. No respiratory distress. He has no wheezes. He has no rales.  Abdominal: Soft. Bowel sounds are normal. He exhibits no distension and no mass. There is no tenderness. There is no rebound and no guarding.  Musculoskeletal: Normal range of motion. He exhibits no edema and no tenderness.       Back:  Neurological: He is alert and oriented to person, place, and time. He has normal strength. No cranial nerve deficit or sensory deficit. He exhibits normal muscle tone. GCS eye subscore is 4. GCS verbal subscore is 5. GCS motor subscore is 6.  Skin: Skin is warm and dry.  Psychiatric: He has a normal mood and affect.    ED Course  Procedures (including critical care time) Labs Review Labs Reviewed  CBC WITH DIFFERENTIAL - Abnormal; Notable for the following:    WBC 22.1 (*)    Neutrophils Relative % 88 (*)    Neutro Abs 19.5 (*)    Lymphocytes Relative 7 (*)    Monocytes Absolute 1.1 (*)    All other components within normal limits  BASIC METABOLIC PANEL - Abnormal; Notable for the following:    Glucose, Bld 166 (*)    Anion gap 19 (*)    All other components within normal limits  I-STAT TROPOININ, ED    Imaging Review Mr  Lumbar Spine Wo Contrast  12/27/2013   CLINICAL DATA:  Lumbar radiculopathy  EXAM: MRI LUMBAR SPINE WITHOUT CONTRAST  TECHNIQUE: Multiplanar, multisequence MR imaging of the lumbar spine was performed. No intravenous contrast was administered.  COMPARISON:  07/09/2009  FINDINGS: Patient was administered 20 mL is of MultiHance intravenous contrast. Patient had a severe anaphylactic reaction to the intravenous contrast. The patient was transported from the imaging center to the emergency department and [facility] by EMS.  The vertebral bodies of the lumbar spine are normal in size. The vertebral bodies of the lumbar spine are normal in alignment. There is normal bone marrow signal demonstrated throughout the vertebra. Posterior lumbar interbody fusion at L3-4 L4-5 with bilateral pedicle screws at each level without failure or complication.  The spinal cord is normal in signal and contour. The cord terminates normally at L1 . The nerve roots of the cauda equina and the filum terminale are normal.  The visualized portions of the SI joints are unremarkable.  The imaged intra-abdominal contents are unremarkable.  T12-L1: No significant disc bulge. No evidence of neural foraminal stenosis. No central canal stenosis.  L1-L2: No significant disc bulge. No evidence of neural foraminal stenosis. No central canal stenosis.  L2-L3: No significant disc bulge. No evidence of neural foraminal stenosis. No central canal stenosis.  L3-L4: Interbody fusion. No evidence of neural foraminal stenosis. No central canal stenosis.  L4-L5: Interbody fusion. No evidence of neural foraminal stenosis. No central canal stenosis.  L5-S1: Mild broad-based disc bulge. Moderate bilateral facet arthropathy. No evidence of neural foraminal stenosis. No central canal stenosis.  IMPRESSION: Patient was administered 20 mL is of MultiHance intravenous contrast. Patient had a severe anaphylactic reaction to the intravenous contrast. The patient  was transported from the imaging center to the emergency department and [facility] by EMS.  1. Posterior lumbar interbody fusion at L3-4 and L4-5 without foraminal or central canal stenosis. 2. Mild broad-based disc bulge and moderate bilateral facet arthropathy at L5-S1.   Electronically Signed   By: Kathreen Devoid   On: 12/27/2013 11:50   Dg Chest Port 1 View  12/27/2013   CLINICAL DATA:  Syncopal episode ; allergic reaction ; long history of smoking  EXAM: PORTABLE CHEST - 1 VIEW  COMPARISON:  PA and lateral chest of Oct 03, 2009  FINDINGS: The lungs are well-expanded and clear. The heart and mediastinal structures are normal. There is no pleural effusion or pneumothorax. The bony thorax is unremarkable.  IMPRESSION: There is no acute cardiopulmonary abnormality.   Electronically Signed   By: David  Martinique   On: 12/27/2013 13:27     EKG Interpretation   Date/Time:  Thursday December 27 2013 11:17:40 EDT Ventricular Rate:  89 PR Interval:  143 QRS Duration: 91 QT Interval:  374 QTC Calculation: 455 R Axis:   89 Text Interpretation:  Sinus rhythm Probable left atrial enlargement No  significant change was found Confirmed by DOCHERTY  MD, MEGAN ([id number]) on  12/27/2013 12:25:02 PM      MDM   Final diagnoses:  Anaphylaxis, initial encounter  Chronic low back pain    Pt is a 43 y.o. male with Pmhx as above who presents from outpt imaging center after report of allergic reaction to contrast. Staff reported that he starting coughing after IV contrast, became diaphoretic,"passed out". He was given benadryl, zantac, epi at imaging center, albuterol & solumedrol en route. On my exam, Pt is sleepy, but in NAD, he reports SOB, but has nml RR and nml O2 sats with clear clung fields. He denies swelling in mouth or throat. He reports chronic back pain, which has been unchanged recently, and has had unchanged numbness of L leg for years. No fever bowel or bladder incontinence. Interestingly, he has had  IVC with MRIs multiple times in the past w/o reaction. I question whether he has taken too large a dose of his roxicodone before MRI today or had syncopal episode due to coughing. Will monitor.   Pt monitored in ED for 3 hours w/o return of symptoms. EKG unchanged, trop neg. He has a nonspecific WBC elevation, but no other s/sx of acute infection. I do not feel he requires emergent MRI as his back pain is unchanged from his chronic symptoms. WIll d/c home w/ 3 days of zantac, pred, benadryl for possible anaphylactic reaction. Return precautions given for new or worsening symptoms including s/sx of anaphylaxis.          Neta Ehlers, MD 12/27/13 [phone]

## 2013-12-27 NOTE — ED Notes (Addendum)
Pt to ED from [place] via [facility] c/o allergic reaction to contrast dye during MRI. Pt started coughing after IV contrast given, became diaphoretic, and unresponsive to staff. Benadryl, Zantac, and 1mg  of 1:1 epi given at [facility]. EMS gave albuterol treatment and solumedrol enroute. Pt alert and oriented to self on arrival

## 2013-12-27 NOTE — ED Notes (Signed)
Pt to ED after allergic reaction to IV contrast dye.  Pt had an outpatient MRI scheduled for back pain. Pt began coughing and became diaphoretic after administration of contrast. Staff at [facility] imaging reports patient passed out and "lost airway." Pt on NRB on arrival.  Alert to self only; unable to state location, day, or situation.  Pt lethargic at this time.  Will respond to name. Reports pain in back.

## 2013-12-27 NOTE — ED Notes (Signed)
EDP at the bedside.  ?

## 2013-12-28 NOTE — Progress Notes (Signed)
Called patient to see how he is feeling after he had an anaphylactic reaction to Gadolinium at the end of his MR yesterday morning.  He states he's doing really well, though he is tired and ready to get a lot of rest this weekend.  He's currently at [facility] with his dad who is there having a procedure requiring a driver.  Mr. Lorey is ready to "get home to take a nap," though he states he feels well and is truly appreciative for all we did to save his life when he coded yesterday.  jkl

## 2014-01-13 ENCOUNTER — Inpatient Hospital Stay
Admit: 2014-01-13 | Discharge: 2014-01-13 | Disposition: A | Discharge status: Home / Self Care | Payer: PRIVATE HEALTH INSURANCE

## 2014-01-13 DIAGNOSIS — T2660XA Corrosion of cornea and conjunctival sac, unspecified eye, initial encounter: Secondary | ICD-10-CM

## 2014-01-13 MED ORDER — tetracaine HCl (PF) (PONTOCAINE) 0.5 % ophthalmic solution Drop 2 drop
0.5 | Freq: Once | OPHTHALMIC | Status: AC
Start: 2014-01-13 — End: 2014-01-13
  Administered 2014-01-13: 21:00:00 2 [drp] via OPHTHALMIC

## 2014-01-13 MED ORDER — lactated ringers 1,000 mL IV fluid
Freq: Once | INTRAVENOUS | Status: AC
Start: 2014-01-13 — End: 2014-01-13
  Administered 2014-01-13: 21:00:00 1000 mL via OPHTHALMIC

## 2014-01-13 MED ORDER — carboxymethylcellulose (REFRESH PLUS) 0.5 % Dpet
0.5 | Freq: Three times a day (TID) | OPHTHALMIC | 1.00 refills | 30.00000 days | Status: AC | PRN
Start: 2014-01-13 — End: ?

## 2014-01-13 MED ORDER — fluorescein (FLUOR-I-STRIP) 1 mg ophthalmic strip
1 | OPHTHALMIC | Status: AC
Start: 2014-01-13 — End: 2014-01-13
  Administered 2014-01-13: 22:00:00 1 via OPHTHALMIC

## 2014-01-13 MED ORDER — fluorescein-benoxinate (FLURATE) ophthalmic solution 1 drop
0.25-0.4 | Freq: Once | OPHTHALMIC | Status: AC
Start: 2014-01-13 — End: 2014-01-13

## 2014-01-13 MED ORDER — lactated ringers 1,000 mL IV fluid
Freq: Once | INTRAVENOUS | Status: AC
Start: 2014-01-13 — End: 2014-01-13

## 2014-01-13 MED ORDER — fluorescein (FLUOR-I-STRIP) ophthalmic strip 1 mg
1 | Freq: Once | OPHTHALMIC | Status: AC
Start: 2014-01-13 — End: 2014-01-13

## 2014-01-13 MED FILL — TETRACAINE HCL (PF) 0.5 % EYE DROPS: 0.5 0.5 % | OPHTHALMIC | Qty: 2

## 2014-01-13 MED FILL — FLUORETS 1 MG EYE STRIPS: 1 1 mg | OPHTHALMIC | Qty: 1

## 2014-01-13 NOTE — Unmapped (Signed)
You were seen in the Emergency Department for eye pain.     Please apply the drops prescribed above 3 times per day as needed for dryness.  If these drops are not available, you may ask the pharmacist over-the-counter moisturizing eyedrops.    Please followup with your ophthalmologist in the next week.    Please return to the Emergency Department, day or night, if your symptoms worsen or change, if you develop fever, swelling, decreased or loss of vision, severe pain.

## 2014-01-13 NOTE — Unmapped (Signed)
Pt states at 2 pm got battery acid in eye
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
## 2014-01-13 NOTE — Unmapped (Signed)
ED Attending Attestation Note    Date of service:  01/13/2014    This patient was seen by the resident physician.  I have seen and examined the patient, agree with the workup, evaluation, management and diagnosis. The care plan has been discussed and I concur.     My assessment reveals a 43 y.o. male in no acute distress.  The patient is well-appearing.  His right eye is slightly injected.  His conjunctiva are irritated.  His pupils equal round and reactive to light.Marland Kitchen

## 2014-03-20 ENCOUNTER — Encounter (HOSPITAL_COMMUNITY): Payer: Self-pay | Admitting: Emergency Medicine

## 2014-03-20 ENCOUNTER — Emergency Department (HOSPITAL_COMMUNITY)
Admission: EM | Admit: 2014-03-20 | Discharge: 2014-03-20 | Disposition: A | Discharge status: Home / Self Care | Payer: Medicare Other | Attending: Emergency Medicine | Admitting: Emergency Medicine

## 2014-03-20 DIAGNOSIS — Z9889 Other specified postprocedural states: Secondary | ICD-10-CM | POA: Insufficient documentation

## 2014-03-20 DIAGNOSIS — M549 Dorsalgia, unspecified: Secondary | ICD-10-CM

## 2014-03-20 DIAGNOSIS — Z8659 Personal history of other mental and behavioral disorders: Secondary | ICD-10-CM | POA: Diagnosis not present

## 2014-03-20 DIAGNOSIS — I10 Essential (primary) hypertension: Secondary | ICD-10-CM | POA: Diagnosis not present

## 2014-03-20 DIAGNOSIS — Z8719 Personal history of other diseases of the digestive system: Secondary | ICD-10-CM | POA: Insufficient documentation

## 2014-03-20 DIAGNOSIS — G8929 Other chronic pain: Secondary | ICD-10-CM | POA: Diagnosis not present

## 2014-03-20 DIAGNOSIS — M545 Low back pain: Secondary | ICD-10-CM | POA: Diagnosis present

## 2014-03-20 DIAGNOSIS — Z8639 Personal history of other endocrine, nutritional and metabolic disease: Secondary | ICD-10-CM | POA: Diagnosis not present

## 2014-03-20 DIAGNOSIS — Z720 Tobacco use: Secondary | ICD-10-CM | POA: Diagnosis not present

## 2014-03-20 DIAGNOSIS — Z880 Allergy status to penicillin: Secondary | ICD-10-CM | POA: Insufficient documentation

## 2014-03-20 DIAGNOSIS — Z79899 Other long term (current) drug therapy: Secondary | ICD-10-CM | POA: Insufficient documentation

## 2014-03-20 DIAGNOSIS — Z86010 Personal history of colonic polyps: Secondary | ICD-10-CM | POA: Insufficient documentation

## 2014-03-20 MED ORDER — OXYCODONE-ACETAMINOPHEN 5-325 MG PO TABS
1.0000 | ORAL_TABLET | Freq: Once | ORAL | Status: AC
  Administered 2014-03-20: 1 via ORAL

## 2014-03-20 MED ORDER — IBUPROFEN 400 MG PO TABS
800.0000 mg | ORAL_TABLET | Freq: Once | ORAL | Status: AC
Start: 2014-03-20 — End: 2014-03-20
  Administered 2014-03-20: 800 mg via ORAL
  Filled 2014-03-20: qty 2

## 2014-03-20 MED ORDER — OXYCODONE-ACETAMINOPHEN 5-325 MG PO TABS
1.0000 | ORAL_TABLET | Freq: Once | ORAL | Status: DC
  Filled 2014-03-20: qty 1

## 2014-03-20 MED ORDER — OXYCODONE-ACETAMINOPHEN 5-325 MG PO TABS: 1.0000 | ORAL_TABLET | Freq: Four times a day (QID) | ORAL | Status: DC | PRN

## 2014-03-20 MED ORDER — IBUPROFEN 800 MG PO TABS: 800.0000 mg | ORAL_TABLET | Freq: Three times a day (TID) | ORAL | Status: DC

## 2014-03-20 NOTE — ED Provider Notes (Signed)
CSN: [id number]     Arrival date & time 03/20/14  1250 History  This chart was scribed for non-physician practitioner, Lorrine Kin, PA-C, working with Varney Biles, MD by Ladene Artist, ED Scribe. This patient was seen in room TR08C/TR08C and the patient's care was started at 2:48 PM.   Chief Complaint  Patient presents with  . Back Pain   HPI Comments: [name] is a 43 y.o. Male, with a h/o chronic back pain, who presents to the Emergency Department complaining of constant L sided lower back pain onset 1 week ago. Pain is exacerbated with movement. He denies fall or injury. Pt has tried ibuprofen once without relief. Last narcotic was yesterday; given to him by a friend. Pt has an upcoming appointment with neurosurgeon Dr. Ronnald Ramp on 04/05/14. And follow up to establish with chronic pain clinic.    The history is provided by the patient. No language interpreter was used.   Past Medical History  Diagnosis Date  . BACK PAIN, CHRONIC 06/17/2007  . 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 HEMORRHOIDS, INTERNAL 06/17/2007  . RECTAL BLEEDING 06/17/2007  . Arthritis   . Hyperlipidemia   . Hypertension   . Lumbar disc disease 03/19/2011  . Depression 04/19/2011   Past Surgical History  Procedure Laterality Date  . Back surgery  2006 and 2011    x 2; Dr Hassel Neth  . Colonoscopy    . Polypectomy    . Tonsillectomy and adenoidectomy     Family History  Problem Relation Age of Onset  . Arthritis Mother   . Heart disease Mother   . Hypertension Mother   . Diabetes Mother   . Colon cancer Neg Hx   . Esophageal cancer Neg Hx   . Stomach cancer Neg Hx   . Rectal cancer Neg Hx    History  Substance Use Topics  . Smoking status: Current Every Day Smoker -- 1.00 packs/day for 29 years    Types: Cigarettes  . Smokeless tobacco: Former Systems developer     Comment: Counseling sheet given in exam room   . Alcohol Use: Yes     Comment: social--twelve pack monthly     Review of Systems   Constitutional: Negative for fever and chills.  Musculoskeletal: Positive for back pain.  All other systems reviewed and are negative.  Allergies  Gadolinium derivatives; Contrast media; Lipitor; Morphine and related; Penicillins; Tramadol; and Valium  Home Medications   Prior to Admission medications   Medication Sig Start Date End Date Taking? Authorizing Provider  hydrochlorothiazide (MICROZIDE) 12.5 MG capsule Take 12.5 mg by mouth daily.   Yes Historical Provider, MD  oxyCODONE (ROXICODONE) 15 MG immediate release tablet Take 15 mg by mouth every 6 (six) hours as needed for pain.   Yes Historical Provider, MD   Triage Vitals: BP 120/76  Pulse 82  Temp(Src) 98.2 F (36.8 C) (Oral)  Resp 18  Ht 5\' 9"  (1.753 m)  Wt 198 lb (89.812 kg)  BMI 29.23 kg/m2  SpO2 98% Physical Exam  Nursing note and vitals reviewed. Constitutional: He is oriented to person, place, and time. He appears well-developed and well-nourished. No distress.  HENT:  Head: Normocephalic and atraumatic.  Eyes: Conjunctivae and EOM are normal.  Neck: Neck supple.  Pulmonary/Chest: Effort normal. No respiratory distress.  Musculoskeletal: Normal range of motion.       Thoracic back: He exhibits tenderness. He exhibits normal range of motion.       Back:  No midline  C-spine, T-spine, or L-spine tenderness with no step-offs, crepitus, or deformities noted.   Neurological: He is alert and oriented to person, place, and time.  Skin: Skin is warm and dry.  Psychiatric: He has a normal mood and affect. His behavior is normal.   ED Course  Procedures (including critical care time) DIAGNOSTIC STUDIES: Oxygen Saturation is 98% on RA, normal by my interpretation.    COORDINATION OF CARE: 2:51 PM-Discussed treatment plan which includes follow-up with specialist with pt at bedside and pt agreed to plan.   Labs Review Labs Reviewed - No data to display  Imaging Review No results found.   EKG  Interpretation None      MDM   Final diagnoses:  Chronic back pain   Patient is a 43 year old male with past medical history of chronic back pain presenting with left-sided back pain for several days. Worsen with movement. No neurologic deficits. Pain reproducible with palpation. Patient reports follow up appointment with a back specialist 11/6 and chronic pain management. 12/27/2013: MRI 1. Posterior lumbar interbody fusion at L3-4 and L4-5 without foraminal or central canal stenosis. 2. Mild broad-based disc bulge and moderate bilateral facet arthropathy at L5-S1. [place] database shows last narcotic description filled 02/11/2014 60 tablets of Norco 10-325. Plan to prescribe short dose of narcotics urged pt to follow up with PCP and back specialist.  Meds given in ED:  Medications  oxyCODONE-acetaminophen (PERCOCET/ROXICET) 5-325 MG per tablet 1 tablet (not administered)  ibuprofen (ADVIL,MOTRIN) tablet 800 mg (not administered)    New Prescriptions   IBUPROFEN (ADVIL,MOTRIN) 800 MG TABLET    Take 1 tablet (800 mg total) by mouth 3 (three) times daily.   OXYCODONE-ACETAMINOPHEN (PERCOCET/ROXICET) 5-325 MG PER TABLET    Take 1 tablet by mouth every 6 (six) hours as needed for moderate pain or severe pain.   I personally performed the services described in this documentation, which was scribed in my presence. The recorded information has been reviewed and is accurate.   Harvie Heck, PA-C 03/20/14 1505

## 2014-03-20 NOTE — Discharge Instructions (Signed)
Call Dr. Ronnald Ramp earlier for further evaluation of your chronic back pain. Call for a follow up appointment with a Family or Primary Care Provider.  Return if Symptoms worsen.   Take medication as prescribed.

## 2014-03-20 NOTE — ED Notes (Signed)
Per pt sts lower back pain after bending over and turning the wrong way. sts suppose to see his doctor on November 6th.

## 2014-03-21 NOTE — ED Provider Notes (Signed)
Medical screening examination/treatment/procedure(s) were performed by non-physician practitioner and as supervising physician I was immediately available for consultation/collaboration.   EKG Interpretation None       Varney Biles, MD 03/21/14 [phone]

## 2014-04-08 ENCOUNTER — Ambulatory Visit: Payer: Self-pay

## 2014-07-05 ENCOUNTER — Inpatient Hospital Stay
Admit: 2014-07-05 | Discharge: 2014-07-05 | Disposition: A | Discharge status: Home / Self Care | Payer: PRIVATE HEALTH INSURANCE

## 2014-07-05 DIAGNOSIS — S339XXA Sprain of unspecified parts of lumbar spine and pelvis, initial encounter: Secondary | ICD-10-CM

## 2014-07-05 MED ORDER — cyclobenzaprineFLEXERIL10MGtablet
10 | ORAL_TABLET | Freq: Three times a day (TID) | ORAL | Status: AC | PRN
Start: 2014-07-05 — End: ?

## 2014-07-05 MED ORDER — cyclobenzaprine (FLEXERIL) tablet 10 mg
10 | Freq: Three times a day (TID) | ORAL | Status: AC | PRN
Start: 2014-07-05 — End: 2014-07-05

## 2014-07-05 NOTE — Unmapped (Signed)
Continue home medications.  Add Flexeril for muscle spasm - do not drink alcohol or drive after taking Flexeril.  Follow-up with your doctor next week for further evaluation and treatment.  Return to the ER for weakness, inability to control bowel/bladder, or any emergent medical concerns.

## 2014-07-05 NOTE — Unmapped (Signed)
patient /co low back pain s/p slipping down 3-4 stairs on wednesday

## 2014-07-05 NOTE — Unmapped (Signed)
ED Attending Attestation Note    Date of service:  07/05/2014    This patient was seen by the mid-level provider.  I have seen and examined the patient, agree with the workup, evaluation, management and diagnosis.  The care plan has been discussed and I concur.      My assessment reveals a 44 y.o. male with a history of chronic back pain.  On exam he is alert no acute distress.  He has tenderness over his paralumbar muscle groups.  There is no spasm however.  He has a negative straight leg sign bilaterally.  His flexion and extension strength of his lower extremities is 5 x 5 and equal.  He has 1+ and equal patellar and ankle reflexes.Marland Kitchen

## 2014-07-05 NOTE — Unmapped (Signed)
[facility] ED Note  Date of Service: 07/05/2014  Reason for Visit: Back Pain      Patient History     HPI:  Edward Tanner is a 44 y.o. male with PMH of chronic back pain who presents to the Emergency Department with a chief complaint of back pain.  Patient reports three days ago he slipped on the stairs and stumbled.  Did not fall or hit back on anything.  Endorses bilateral lower back pain worse on the left with some numbness and tinging in his left leg.  Able to ambulate, no urinary incontinence/retention.  Takes ibuprofen and Tramadol everyday as prescribed by his primary care doctor.  No h/o back surgery.       Past Medical History   Diagnosis Date   ??? ADHD (attention deficit hyperactivity disorder)    ??? Herpes infection    ??? Low back pain    ??? GERD (gastroesophageal reflux disease)        Past Surgical History   Procedure Laterality Date   ??? Eye surgery         No family history on file.    Edward Tanner November  reports that he has never smoked. He does not have any smokeless tobacco history on file. He reports that he drinks alcohol. He reports that he does not use illicit drugs.    Discharge Medication List as of 07/05/2014  3:10 PM      CONTINUE these medications which have NOT CHANGED    Details   omeprazole (PRILOSEC) 10 MG capsule Take 10 mg by mouth every morning before breakfast., Until Discontinued, Historical Med      carboxymethylcellulose (REFRESH PLUS) 0.5 % Dpet Place 1 drop into the right eye 3 times a day as needed., Starting 01/13/2014, Until Discontinued, Print      dextroamphetamine-amphetamine (ADDERALL XR) 15 MG 24 hr capsule Take 1 capsule by mouth daily ADHD Code: 314.01.  OK to fill 12/22/13, Starting 12/20/2013, Until Discontinued, Historical Med      ibuprofen (ADVIL,MOTRIN) 800 MG tablet Take 1 tablet by mouth every 8 hours as needed for Pain, Starting 12/27/2013, Until Discontinued, Historical Med      traMADol (ULTRAM) 50 mg tablet TAKE ONE TABLET BY MOUTH FOUR TIMES A DAY AS NEEDED, Starting  09/10/2013, Until Discontinued, Historical Med      valACYclovir (VALTREX) 1000 MG tablet TAKE TWO TABLETS BY MOUTH AT ONSET OF COLD SORE THEN REPEAT IN 12 HOURS, Starting 03/02/2013, Until Discontinued, Historical Med             Allergies:   Allergies as of 07/05/2014 - Fully Reviewed 07/05/2014   Allergen Reaction Noted   ??? Penicillins  01/13/2014       Review of Systems     Review of Systems   Constitutional: Negative.  Negative for fever.   Respiratory: Negative.    Cardiovascular: Negative.    Genitourinary: Negative.  Negative for dysuria, urgency, frequency, hematuria and flank pain.   Musculoskeletal: Positive for back pain. Negative for falls and neck pain.   Neurological: Positive for tingling. Negative for sensory change, speech change, focal weakness and loss of consciousness.   All other systems reviewed and are negative.          Physical Exam     General: Well-developed well-nourished in no acute distress.  HEENT: Head normocephalic atraumatic.  External ears and nose normal.   Neck: Full range of motion, supple.  No cervical vertebral TTP.  Pulmonary: Lungs clear to auscultation bilaterally.  No wheezing, rhonchi, or rales.  Cardiac: Regular rate and rhythm.  No murmurs, rubs, or gallops.  Clear S1, S2.  Musculoskeletal: Moving all extremities appropriately, no obvious weakness noted.  +TTP left lumbar region with some muscle spasm palpable.  No vertebral TTP.  +5/5 strength bilateral lower extremities.  Able to stand on toes/heels.  Equal patellar reflexes.  Vascular: Palpable pulses to all extremities.  No pitting edema noted.  Skin: Warm and dry.  Neuro: Alert and oriented.  Cranial nerves II through XII without deficit.    Psych:  Normal affect, Normal judgement, Normal mood.  Normal affect, and behavior.    ED Course and MDM     MEDICAL DECISION MAKING    RECENT VITALS:  BP: 126/83 mmHg, Temp: 98 ??F (36.7 ??C), Heart Rate: 79, Resp: 18     RADIOLOGY:  No orders to display       LABS:   Labs  Reviewed - No data to display    MEDS:  Medications   cyclobenzaprine (FLEXERIL) tablet 10 mg (not administered)       PROCEDURES: N/A    CONSULTS:  None    MEDICAL DECISION MAKING / ED COURSE:    The patient was seen and examined by myself and presented to Dr. No att. providers found, who also saw the patient.    Edward Tanner is a 44 y.o. male with PMH of chronic back pain who presents to the Emergency Department with a chief complaint of back pain.  Physical exam consistent with a left lumbar strain with left radicular symptoms.  No signs or symptoms of cord compression.  Ambulating well, neurovascularly intact.  No x-rays indicated based on history and physical with lack of any direct trauma.  To continue ibuprofen and tramadol, will add a muscle relaxant.  To follow-up with PMD for further management.      The patient tolerated their visit well.  They were seen and evaluated by the attending physician who agreed with the assessment and plan.  The patient and / or the family were informed of the results of any tests, a time was given to answer questions, a plan was proposed and they agreed with plan.      DISCHARGE DIAGNOSIS:  1. Lumbosacral strain, initial encounter        PATIENT REFERRED TO:  Edward Friendly, MD  [address]  [place] [address]  [phone]    In 3 days      [facility] Emergency Department  [address]  [place] [address]  [phone]    As needed, If symptoms worsen      DISCHARGE MEDICATIONS:  Discharge Medication List as of 07/05/2014  3:10 PM      START taking these medications    Details   cyclobenzaprine (FLEXERIL) 10 MG tablet Take 1 tablet (10 mg total) by mouth 3 times a day as needed for Muscle spasms., Starting 07/05/2014, Until Discontinued, Print               Critical Care Time (Attendings)           Maeola Harman [place], [place]  07/05/14 1524

## 2016-01-26 ENCOUNTER — Emergency Department (HOSPITAL_COMMUNITY): Payer: Medicare Other

## 2016-01-26 ENCOUNTER — Emergency Department (HOSPITAL_COMMUNITY)
Admission: EM | Admit: 2016-01-26 | Discharge: 2016-01-26 | Disposition: A | Discharge status: Home / Self Care | Payer: Medicare Other | Attending: Emergency Medicine | Admitting: Emergency Medicine

## 2016-01-26 ENCOUNTER — Encounter (HOSPITAL_COMMUNITY): Payer: Self-pay | Admitting: Physical Medicine and Rehabilitation

## 2016-01-26 DIAGNOSIS — F17210 Nicotine dependence, cigarettes, uncomplicated: Secondary | ICD-10-CM | POA: Insufficient documentation

## 2016-01-26 DIAGNOSIS — Y999 Unspecified external cause status: Secondary | ICD-10-CM | POA: Diagnosis not present

## 2016-01-26 DIAGNOSIS — W500XXA Accidental hit or strike by another person, initial encounter: Secondary | ICD-10-CM | POA: Insufficient documentation

## 2016-01-26 DIAGNOSIS — Z79899 Other long term (current) drug therapy: Secondary | ICD-10-CM | POA: Diagnosis not present

## 2016-01-26 DIAGNOSIS — Y929 Unspecified place or not applicable: Secondary | ICD-10-CM | POA: Insufficient documentation

## 2016-01-26 DIAGNOSIS — M549 Dorsalgia, unspecified: Secondary | ICD-10-CM | POA: Diagnosis not present

## 2016-01-26 DIAGNOSIS — Y939 Activity, unspecified: Secondary | ICD-10-CM | POA: Insufficient documentation

## 2016-01-26 DIAGNOSIS — I10 Essential (primary) hypertension: Secondary | ICD-10-CM | POA: Diagnosis not present

## 2016-01-26 MED ORDER — OXYCODONE-ACETAMINOPHEN 5-325 MG PO TABS
2.0000 | ORAL_TABLET | Freq: Once | ORAL | Status: AC
  Administered 2016-01-26: 2 via ORAL
  Filled 2016-01-26: qty 2

## 2016-01-26 MED ORDER — OXYCODONE-ACETAMINOPHEN 5-325 MG PO TABS: 1.0000 | ORAL_TABLET | ORAL | 0 refills | Status: DC | PRN

## 2016-01-26 NOTE — ED Notes (Signed)
Declined W/C at D/C and was escorted to lobby by RN. 
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
## 2016-01-26 NOTE — Discharge Instructions (Signed)
Take the prescribed medication as directed. Follow-up with your primary care doctor on Thursday as scheduled. Return to the ED for new or worsening symptoms.

## 2016-02-18 ENCOUNTER — Ambulatory Visit
Admission: RE | Admit: 2016-02-18 | Discharge: 2016-02-18 | Disposition: A | Discharge status: Home / Self Care | Payer: Medicare Other | Source: Ambulatory Visit | Attending: Family Medicine | Admitting: Family Medicine

## 2016-02-18 ENCOUNTER — Other Ambulatory Visit: Payer: Self-pay | Admitting: Family Medicine

## 2016-02-18 DIAGNOSIS — Z720 Tobacco use: Secondary | ICD-10-CM

## 2016-02-18 DIAGNOSIS — R634 Abnormal weight loss: Secondary | ICD-10-CM

## 2016-08-24 ENCOUNTER — Emergency Department (HOSPITAL_COMMUNITY)
Admission: EM | Admit: 2016-08-24 | Discharge: 2016-08-24 | Disposition: A | Discharge status: Home / Self Care | Payer: Medicare Other | Attending: Emergency Medicine | Admitting: Emergency Medicine

## 2016-08-24 ENCOUNTER — Encounter (HOSPITAL_COMMUNITY): Payer: Self-pay

## 2016-08-24 DIAGNOSIS — Y929 Unspecified place or not applicable: Secondary | ICD-10-CM | POA: Diagnosis not present

## 2016-08-24 DIAGNOSIS — I10 Essential (primary) hypertension: Secondary | ICD-10-CM | POA: Insufficient documentation

## 2016-08-24 DIAGNOSIS — F17210 Nicotine dependence, cigarettes, uncomplicated: Secondary | ICD-10-CM | POA: Diagnosis not present

## 2016-08-24 DIAGNOSIS — S39012A Strain of muscle, fascia and tendon of lower back, initial encounter: Secondary | ICD-10-CM | POA: Diagnosis not present

## 2016-08-24 DIAGNOSIS — Y939 Activity, unspecified: Secondary | ICD-10-CM | POA: Diagnosis not present

## 2016-08-24 DIAGNOSIS — S3992XA Unspecified injury of lower back, initial encounter: Secondary | ICD-10-CM | POA: Diagnosis present

## 2016-08-24 DIAGNOSIS — X501XXA Overexertion from prolonged static or awkward postures, initial encounter: Secondary | ICD-10-CM | POA: Insufficient documentation

## 2016-08-24 DIAGNOSIS — Y999 Unspecified external cause status: Secondary | ICD-10-CM | POA: Diagnosis not present

## 2016-08-24 MED ORDER — KETOROLAC TROMETHAMINE 30 MG/ML IJ SOLN
60.0000 mg | Freq: Once | INTRAMUSCULAR | Status: AC
  Administered 2016-08-24: 60 mg via INTRAMUSCULAR
  Filled 2016-08-24: qty 2

## 2016-08-24 MED ORDER — CYCLOBENZAPRINE HCL 10 MG PO TABS: 10.0000 mg | ORAL_TABLET | Freq: Three times a day (TID) | ORAL | 0 refills | Status: DC | PRN

## 2016-08-24 MED ORDER — PREDNISONE 50 MG PO TABS: 50.0000 mg | ORAL_TABLET | Freq: Every day | ORAL | 0 refills | Status: DC

## 2016-08-24 MED ORDER — OXYCODONE-ACETAMINOPHEN 5-325 MG PO TABS
1.0000 | ORAL_TABLET | Freq: Once | ORAL | Status: AC
  Administered 2016-08-24: 1 via ORAL
  Filled 2016-08-24: qty 1

## 2016-08-24 NOTE — ED Triage Notes (Signed)
Pt states he twisted his back last night and has pain in the lumbar area since. Hx of back pain. Pt ambulatory. Pt denies any new extremity weakness. No bowel or urinary incontinence.

## 2016-08-24 NOTE — Discharge Instructions (Signed)
Return here as needed. Follow up with your spine surgeon. You have a 30 day supply of oxycodone 10 mg tablets from a physician and by law I am not able to prescribe any further narcotics. You will need to see your prescribing physician for any further pain medications.

## 2016-08-27 NOTE — ED Provider Notes (Signed)
[facility] DEPT Provider Note   CSN: [id number] Arrival date & time: 08/24/16  0706     History   Chief Complaint Chief Complaint  Patient presents with  . Back Pain    HPI [name] is a 46 y.o. male.  HPI Patient presents to the emergency department with laceration of his chronic back pain.  The patient states that he twisted and noticed that he had increased back pain in the lumbar region.  He states that he likely has back pain but this is worse due to the twisting injury.  The patient states that nothing seems to make the condition better, movement and palpation makes the pain worseThe patient denies chest pain, shortness of breath, headache,blurred vision, neck pain, fever, cough, weakness, numbness, dizziness, anorexia, edema, abdominal pain, nausea, vomiting, diarrhea, rash, incontinence dysuria, hematemesis, bloody stool, near syncope, or syncope. Past Medical History:  Diagnosis Date  . Arthritis   . BACK PAIN, CHRONIC 06/17/2007  . 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 Depression 04/19/2011  . HEMORRHOIDS, INTERNAL 06/17/2007  . Hyperlipidemia   . Hypertension   . Lumbar disc disease 03/19/2011  . RECTAL BLEEDING 06/17/2007    Patient Active Problem List   Diagnosis Date Noted  . Bronchitis 06/12/2012  . Abdominal pain, epigastric 10/04/2011  . GERD (gastroesophageal reflux disease) 08/11/2011  . Anxiety 07/06/2011  . Depression 04/19/2011  . Insomnia 04/19/2011  . Encounter for long-term (current) use of high-risk medication 04/19/2011  . Lumbar disc disease 03/19/2011  . Fatigue 03/19/2011  . Bladder neck obstruction 03/19/2011  . Smoker 03/19/2011  . Arthritis   . Hyperlipidemia   . Hypertension   . Preventative health care 03/16/2011  . HEMORRHOIDS, INTERNAL 06/17/2007  . RECTAL BLEEDING 06/17/2007  . BACK PAIN, CHRONIC 06/17/2007  . 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 BACK  SURGERY  2006 and 2011   x 2; Dr Hassel Neth  . COLONOSCOPY    . POLYPECTOMY    . TONSILLECTOMY AND ADENOIDECTOMY         Home Medications    Prior to Admission medications   Medication Sig Start Date End Date Taking? Authorizing Provider  cyclobenzaprine (FLEXERIL) 10 MG tablet Take 1 tablet (10 mg total) by mouth 3 (three) times daily as needed for muscle spasms. 08/24/16   Dalia Heading, PA-C  hydrochlorothiazide (MICROZIDE) 12.5 MG capsule Take 12.5 mg by mouth daily.    Historical Provider, MD  ibuprofen (ADVIL,MOTRIN) 800 MG tablet Take 1 tablet (800 mg total) by mouth 3 (three) times daily. 03/20/14   Harvie Heck, PA-C  oxyCODONE (ROXICODONE) 15 MG immediate release tablet Take 15 mg by mouth every 6 (six) hours as needed for pain.    Historical Provider, MD  oxyCODONE-acetaminophen (PERCOCET/ROXICET) 5-325 MG tablet Take 1 tablet by mouth every 4 (four) hours as needed. 01/26/16   Larene Pickett, PA-C  predniSONE (DELTASONE) 50 MG tablet Take 1 tablet (50 mg total) by mouth daily. 08/24/16   Dalia Heading, PA-C    Family History Family History  Problem Relation Age of Onset  . Arthritis Mother   . Heart disease Mother   . Hypertension Mother   . Diabetes Mother   . Colon cancer Neg Hx   . Esophageal cancer Neg Hx   . Stomach cancer Neg Hx   . Rectal cancer Neg Hx     Social History Social History  Substance Use Topics  . Smoking status:  Current Every Day Smoker    Packs/day: 1.00    Years: 29.00    Types: Cigarettes  . Smokeless tobacco: Former Systems developer     Comment: Counseling sheet given in exam room   . Alcohol use Yes     Comment: social--twelve pack monthly      Allergies   Gadolinium derivatives; Contrast media [iodinated diagnostic agents]; Lipitor [atorvastatin]; Morphine and related; Norco [hydrocodone-acetaminophen]; Penicillins; Tramadol; and Valium   Review of Systems Review of Systems All other systems negative except as documented in the  HPI. All pertinent positives and negatives as reviewed in the HPI.  Physical Exam Updated Vital Signs BP 134/79 (BP Location: Right Arm)   Pulse 76   Temp 98.3 F (36.8 C) (Oral)   Resp 18   SpO2 99%   Physical Exam  Constitutional: He is oriented to person, place, and time. He appears well-developed and well-nourished. No distress.  HENT:  Head: Normocephalic and atraumatic.  Eyes: Pupils are equal, round, and reactive to light.  Pulmonary/Chest: Effort normal.  Musculoskeletal:       Lumbar back: He exhibits tenderness and pain. He exhibits normal range of motion, no bony tenderness, no swelling, no deformity, no laceration and no spasm.  Neurological: He is alert and oriented to person, place, and time. He has normal strength. He displays normal reflexes. No sensory deficit. He exhibits normal muscle tone. Coordination and gait normal.  Skin: Skin is warm and dry.  Psychiatric: He has a normal mood and affect.  Nursing note and vitals reviewed.    ED Treatments / Results  Labs (all labs ordered are listed, but only abnormal results are displayed) Labs Reviewed - No data to display  EKG  EKG Interpretation None       Radiology No results found.  Procedures Procedures (including critical care time)  Medications Ordered in ED Medications  oxyCODONE-acetaminophen (PERCOCET/ROXICET) 5-325 MG per tablet 1 tablet (1 tablet Oral Given 08/24/16 0842)  ketorolac (TORADOL) 30 MG/ML injection 60 mg (60 mg Intramuscular Given 08/24/16 0842)     Initial Impression / Assessment and Plan / ED Course  I have reviewed the triage vital signs and the nursing notes.  Pertinent labs & imaging results that were available during my care of the patient were reviewed by me and considered in my medical decision making (see chart for details).    Patient has no neurological deficits noted on exam.  The patient will be treated for lumbar strain is associated with his chronic back pain.   The patient is advised follow-up with his primary care doctor.  The patient does get a chronic pain prescription.  Therefore, I will only provide him with muscle relaxants and prednisone.  Patient agrees the plan and all questions were answered.  Patient has normal reflexes   Final Clinical Impressions(s) / ED Diagnoses   Final diagnoses:  Strain of lumbar region, initial encounter    New Prescriptions Discharge Medication List as of 08/24/2016  8:44 AM    START taking these medications   Details  cyclobenzaprine (FLEXERIL) 10 MG tablet Take 1 tablet (10 mg total) by mouth 3 (three) times daily as needed for muscle spasms., Starting Tue 08/24/2016, Print    predniSONE (DELTASONE) 50 MG tablet Take 1 tablet (50 mg total) by mouth daily., Starting Tue 08/24/2016, Print         Dalia Heading, PA-C 08/27/16 1659    Virgel Manifold, MD 08/28/16 [phone]

## 2016-10-08 ENCOUNTER — Emergency Department (HOSPITAL_COMMUNITY): Payer: Medicare Other

## 2016-10-08 ENCOUNTER — Encounter (HOSPITAL_COMMUNITY): Payer: Self-pay

## 2016-10-08 ENCOUNTER — Emergency Department (HOSPITAL_BASED_OUTPATIENT_CLINIC_OR_DEPARTMENT_OTHER): Admit: 2016-10-08 | Discharge: 2016-10-08 | Disposition: A | Discharge status: Home / Self Care | Payer: Medicare Other

## 2016-10-08 ENCOUNTER — Emergency Department (HOSPITAL_COMMUNITY)
Admission: EM | Admit: 2016-10-08 | Discharge: 2016-10-08 | Disposition: A | Discharge status: Home / Self Care | Payer: Medicare Other | Attending: Emergency Medicine | Admitting: Emergency Medicine

## 2016-10-08 DIAGNOSIS — S8991XA Unspecified injury of right lower leg, initial encounter: Secondary | ICD-10-CM | POA: Diagnosis present

## 2016-10-08 DIAGNOSIS — S80211A Abrasion, right knee, initial encounter: Secondary | ICD-10-CM | POA: Diagnosis not present

## 2016-10-08 DIAGNOSIS — Y9301 Activity, walking, marching and hiking: Secondary | ICD-10-CM | POA: Insufficient documentation

## 2016-10-08 DIAGNOSIS — I10 Essential (primary) hypertension: Secondary | ICD-10-CM | POA: Insufficient documentation

## 2016-10-08 DIAGNOSIS — M79609 Pain in unspecified limb: Secondary | ICD-10-CM

## 2016-10-08 DIAGNOSIS — Y9289 Other specified places as the place of occurrence of the external cause: Secondary | ICD-10-CM | POA: Insufficient documentation

## 2016-10-08 DIAGNOSIS — Z79899 Other long term (current) drug therapy: Secondary | ICD-10-CM | POA: Insufficient documentation

## 2016-10-08 DIAGNOSIS — F17210 Nicotine dependence, cigarettes, uncomplicated: Secondary | ICD-10-CM | POA: Insufficient documentation

## 2016-10-08 DIAGNOSIS — R609 Edema, unspecified: Secondary | ICD-10-CM

## 2016-10-08 DIAGNOSIS — R600 Localized edema: Secondary | ICD-10-CM | POA: Diagnosis not present

## 2016-10-08 DIAGNOSIS — R748 Abnormal levels of other serum enzymes: Secondary | ICD-10-CM | POA: Diagnosis not present

## 2016-10-08 DIAGNOSIS — Y999 Unspecified external cause status: Secondary | ICD-10-CM | POA: Insufficient documentation

## 2016-10-08 DIAGNOSIS — W19XXXA Unspecified fall, initial encounter: Secondary | ICD-10-CM | POA: Insufficient documentation

## 2016-10-08 LAB — CBC WITH DIFFERENTIAL/PLATELET
BASOS ABS: 0 10*3/uL (ref 0.0–0.1)
Basophils Relative: 1 %
Eosinophils Absolute: 0.4 10*3/uL (ref 0.0–0.7)
Eosinophils Relative: 6 %
HCT: 34.9 % — ABNORMAL LOW (ref 39.0–52.0)
Hemoglobin: 11.6 g/dL — ABNORMAL LOW (ref 13.0–17.0)
LYMPHS PCT: 39 %
Lymphs Abs: 2.2 10*3/uL (ref 0.7–4.0)
MCH: 32 pg (ref 26.0–34.0)
MCHC: 33.2 g/dL (ref 30.0–36.0)
MCV: 96.1 fL (ref 78.0–100.0)
MONO ABS: 0.3 10*3/uL (ref 0.1–1.0)
MONOS PCT: 5 %
NEUTROS PCT: 49 %
Neutro Abs: 2.8 10*3/uL (ref 1.7–7.7)
Platelets: 228 10*3/uL (ref 150–400)
RBC: 3.63 MIL/uL — ABNORMAL LOW (ref 4.22–5.81)
RDW: 13.3 % (ref 11.5–15.5)
WBC: 5.6 10*3/uL (ref 4.0–10.5)

## 2016-10-08 LAB — COMPREHENSIVE METABOLIC PANEL
ALBUMIN: 3.4 g/dL — AB (ref 3.5–5.0)
ALK PHOS: 138 U/L — AB (ref 38–126)
ALT: 33 U/L (ref 17–63)
AST: 30 U/L (ref 15–41)
Anion gap: 8 (ref 5–15)
BUN: 11 mg/dL (ref 6–20)
CO2: 26 mmol/L (ref 22–32)
Calcium: 8.8 mg/dL — ABNORMAL LOW (ref 8.9–10.3)
Chloride: 102 mmol/L (ref 101–111)
Creatinine, Ser: 0.84 mg/dL (ref 0.61–1.24)
GFR calc non Af Amer: >60 mL/min (ref >60)
GLUCOSE: 91 mg/dL (ref 65–99)
Potassium: 4.3 mmol/L (ref 3.5–5.1)
SODIUM: 136 mmol/L (ref 135–145)
Total Bilirubin: 0.3 mg/dL (ref 0.3–1.2)
Total Protein: 6.3 g/dL — ABNORMAL LOW (ref 6.5–8.1)

## 2016-10-08 MED ORDER — OXYCODONE-ACETAMINOPHEN 5-325 MG PO TABS
2.0000 | ORAL_TABLET | Freq: Once | ORAL | Status: AC
  Administered 2016-10-08: 2 via ORAL
  Filled 2016-10-08: qty 2

## 2016-10-08 MED ORDER — ONDANSETRON 4 MG PO TBDP
4.0000 mg | ORAL_TABLET | Freq: Once | ORAL | Status: AC
  Administered 2016-10-08: 4 mg via ORAL
  Filled 2016-10-08: qty 1

## 2016-10-08 MED ORDER — ONDANSETRON HCL 4 MG/2ML IJ SOLN: 4.0000 mg | Freq: Once | INTRAMUSCULAR | Status: DC

## 2016-10-08 NOTE — ED Triage Notes (Signed)
Pt says he has right leg swelling X2 days. He does report a fall. Non pitting edema noted. He also has a knot in the right calf area.

## 2016-10-08 NOTE — Discharge Instructions (Signed)
Have your Alkaline phosphatase level rechecked

## 2016-10-08 NOTE — ED Provider Notes (Signed)
[facility] DEPT Provider Note   CSN: [id number] Arrival date & time: 10/08/16  1112     History   Chief Complaint Chief Complaint  Patient presents with  . Leg Swelling    HPI [name] is a 46 y.o. male who presents emergency Department with chief complaint of right leg pain and swelling. Patient states that over the past 2 or 3 days. She's had swelling in both legs, which is new. He has pain in the right knee after he fell onto it walking up some steps yesterday and suffered a small abrasion. He is also complaining of significant pain and swelling in the right lower calf, which is his main complaint. He denies a previous history of DVT or pulmonary embolus. He denies chest pain or shortness of breath. Patient denies any known medical issues. However, he does see a doctor for his chronic back pain. He states he does not get normal physical exams. He does not routinely get blood work done. The patient is a current daily smoker and he states he will quit "when I'm dead and in the ground." Patient states that he might have some kidney problems because he has gone several days without making urine before. He denies alcohol abuse or drug abuse. Marland KitchenHPI  Past Medical History:  Diagnosis Date  . Arthritis   . BACK PAIN, CHRONIC 06/17/2007  . 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 Depression 04/19/2011  . HEMORRHOIDS, INTERNAL 06/17/2007  . Hyperlipidemia   . Hypertension   . Lumbar disc disease 03/19/2011  . RECTAL BLEEDING 06/17/2007    Patient Active Problem List   Diagnosis Date Noted  . Bronchitis 06/12/2012  . Abdominal pain, epigastric 10/04/2011  . GERD (gastroesophageal reflux disease) 08/11/2011  . Anxiety 07/06/2011  . Depression 04/19/2011  . Insomnia 04/19/2011  . Encounter for long-term (current) use of high-risk medication 04/19/2011  . Lumbar disc disease 03/19/2011  . Fatigue 03/19/2011  . Bladder neck obstruction 03/19/2011  . Smoker 03/19/2011  .  Arthritis   . Hyperlipidemia   . Hypertension   . Preventative health care 03/16/2011  . HEMORRHOIDS, INTERNAL 06/17/2007  . RECTAL BLEEDING 06/17/2007  . BACK PAIN, CHRONIC 06/17/2007  . 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 BACK SURGERY  2006 and 2011   x 2; Dr Hassel Neth  . COLONOSCOPY    . POLYPECTOMY    . TONSILLECTOMY AND ADENOIDECTOMY         Home Medications    Prior to Admission medications   Medication Sig Start Date End Date Taking? Authorizing Provider  cyclobenzaprine (FLEXERIL) 10 MG tablet Take 1 tablet (10 mg total) by mouth 3 (three) times daily as needed for muscle spasms. 08/24/16   Lawyer, Harrell Gave, PA-C  hydrochlorothiazide (MICROZIDE) 12.5 MG capsule Take 12.5 mg by mouth daily.    [provider]  ibuprofen (ADVIL,MOTRIN) 800 MG tablet Take 1 tablet (800 mg total) by mouth 3 (three) times daily. 03/20/14   Harvie Heck, PA-C  oxyCODONE (ROXICODONE) 15 MG immediate release tablet Take 15 mg by mouth every 6 (six) hours as needed for pain.    [provider]  oxyCODONE-acetaminophen (PERCOCET/ROXICET) 5-325 MG tablet Take 1 tablet by mouth every 4 (four) hours as needed. 01/26/16   Larene Pickett, PA-C  predniSONE (DELTASONE) 50 MG tablet Take 1 tablet (50 mg total) by mouth daily. 08/24/16   Dalia Heading, PA-C    Family History Family History  Problem Relation Age of Onset  . Arthritis Mother   . Heart disease Mother   . Hypertension Mother   . Diabetes Mother   . Colon cancer Neg Hx   . Esophageal cancer Neg Hx   . Stomach cancer Neg Hx   . Rectal cancer Neg Hx     Social History Social History  Substance Use Topics  . Smoking status: Current Every Day Smoker    Packs/day: 1.00    Years: 29.00    Types: Cigarettes  . Smokeless tobacco: Former Systems developer     Comment: Counseling sheet given in exam room   . Alcohol use Yes     Comment: social--twelve pack monthly       Allergies   Gadolinium derivatives; Contrast media [iodinated diagnostic agents]; Lipitor [atorvastatin]; Morphine and related; Norco [hydrocodone-acetaminophen]; Penicillins; Tramadol; and Valium   Review of Systems Review of Systems Ten systems reviewed and are negative for acute change, except as noted in the HPI.    Physical Exam Updated Vital Signs BP 125/77 (BP Location: Right Arm)   Pulse 78   Temp 98 F (36.7 C) (Oral)   Resp 18   SpO2 97%   Physical Exam  Constitutional: He appears well-developed and well-nourished. No distress.  HENT:  Head: Normocephalic and atraumatic.  Eyes: Conjunctivae are normal. No scleral icterus.  Neck: Normal range of motion. Neck supple.  Cardiovascular: Normal rate, regular rhythm and normal heart sounds.   Pulmonary/Chest: Effort normal and breath sounds normal. No respiratory distress.  Abdominal: Soft. There is no tenderness.  Musculoskeletal: He exhibits no edema.  Abrasion to the right knee. Minimal tenderness to palpation. Full range of motion without significant pain and no ligamentous laxity on examination. Left calf and foot are markedly more edematous than the right. There is pitting edema. Pulses are palpable.  Neurological: He is alert.  Skin: Skin is warm and dry. He is not diaphoretic.  Psychiatric: His behavior is normal.  Nursing note and vitals reviewed.    ED Treatments / Results  Labs (all labs ordered are listed, but only abnormal results are displayed) Labs Reviewed  COMPREHENSIVE METABOLIC PANEL - Abnormal; Notable for the following:       Result Value   Calcium 8.8 (*)    Total Protein 6.3 (*)    Albumin 3.4 (*)    Alkaline Phosphatase 138 (*)    All other components within normal limits  CBC WITH DIFFERENTIAL/PLATELET - Abnormal; Notable for the following:    RBC 3.63 (*)    Hemoglobin 11.6 (*)    HCT 34.9 (*)    All other components within normal limits    EKG  EKG Interpretation None         Radiology Dg Knee Complete 4 Views Right  Result Date: 10/08/2016 CLINICAL DATA:  [place] 1 week ago with abrasions. Swelling and pain in the right knee. EXAM: RIGHT KNEE - COMPLETE 4+ VIEW COMPARISON:  None. FINDINGS: Negative for a fracture, dislocation or joint effusion. Mild spurring along the patellofemoral compartment of the knee. Alignment of the knee is normal. IMPRESSION: No acute abnormality to the right knee. Electronically Signed   By: Markus Daft M.D.   On: 10/08/2016 13:44    Procedures Procedures (including critical care time)  Medications Ordered in ED Medications  oxyCODONE-acetaminophen (PERCOCET/ROXICET) 5-325 MG per tablet 2 tablet (2 tablets Oral Given 10/08/16 1223)  ondansetron (ZOFRAN-ODT) disintegrating tablet 4 mg (4 mg Oral Given 10/08/16 1223)  Initial Impression / Assessment and Plan / ED Course  I have reviewed the triage vital signs and the nursing notes.  Pertinent labs & imaging results that were available during my care of the patient were reviewed by me and considered in my medical decision making (see chart for details).      patient work up shows isolated elevation in ALP. No abdominal tenderness or icterus. Lab value is nonspecific and should be rechecked in the op setting which I discussed with the patient. The knee xray is negative as well as the DVT shod. I have asked that the patient follow up with a pcp and ortho for worsening knee sxs He appears safe for discharge. Discussed return precautions.  Final Clinical Impressions(s) / ED Diagnoses   Final diagnoses:  Knee injury, right, initial encounter  Peripheral edema  Elevated alkaline phosphatase level    New Prescriptions Discharge Medication List as of 10/08/2016  2:04 PM       Margarita Mail, PA-C 10/08/16 1950    Dorie Rank, MD 10/09/16 1029

## 2016-10-08 NOTE — ED Notes (Signed)
Patient transported to X-ray 

## 2016-10-08 NOTE — Progress Notes (Signed)
**  Preliminary report by tech**  Right lower extremity venous duplex complete. There is no evidence of deep or superficial vein thrombosis involving the right lower extremity. All clearly visualized vessels appear patent and compressible. There is no evidence of a Baker's cyst on the right. Results were given to Margarita Mail PA.  10/08/16 1:03 PM Carlos Levering RVT

## 2016-11-28 ENCOUNTER — Encounter (HOSPITAL_COMMUNITY): Payer: Self-pay | Admitting: *Deleted

## 2016-11-28 ENCOUNTER — Emergency Department (HOSPITAL_COMMUNITY)
Admission: EM | Admit: 2016-11-28 | Discharge: 2016-11-28 | Disposition: A | Discharge status: Home / Self Care | Payer: Medicare Other | Attending: Emergency Medicine | Admitting: Emergency Medicine

## 2016-11-28 ENCOUNTER — Emergency Department (HOSPITAL_COMMUNITY): Payer: Medicare Other

## 2016-11-28 DIAGNOSIS — Z791 Long term (current) use of non-steroidal anti-inflammatories (NSAID): Secondary | ICD-10-CM | POA: Insufficient documentation

## 2016-11-28 DIAGNOSIS — Z79899 Other long term (current) drug therapy: Secondary | ICD-10-CM | POA: Diagnosis not present

## 2016-11-28 DIAGNOSIS — M25522 Pain in left elbow: Secondary | ICD-10-CM

## 2016-11-28 DIAGNOSIS — F17210 Nicotine dependence, cigarettes, uncomplicated: Secondary | ICD-10-CM | POA: Diagnosis not present

## 2016-11-28 DIAGNOSIS — I10 Essential (primary) hypertension: Secondary | ICD-10-CM | POA: Insufficient documentation

## 2016-11-28 MED ORDER — IBUPROFEN 400 MG PO TABS
600.0000 mg | ORAL_TABLET | Freq: Once | ORAL | Status: AC
  Administered 2016-11-28: 12:00:00 600 mg via ORAL
  Filled 2016-11-28: qty 1

## 2016-11-28 MED ORDER — NAPROXEN 500 MG PO TABS: 500.0000 mg | ORAL_TABLET | Freq: Two times a day (BID) | ORAL | 0 refills | Status: DC

## 2016-11-28 MED ORDER — HYDROCODONE-ACETAMINOPHEN 5-325 MG PO TABS
1.0000 | ORAL_TABLET | Freq: Once | ORAL | Status: AC
  Administered 2016-11-28: 1 via ORAL
  Filled 2016-11-28: qty 1

## 2016-11-28 NOTE — ED Provider Notes (Signed)
[name] Provider Note   CSN: [id number] Arrival date & time: 11/28/16  1655     History   Chief Complaint Chief Complaint  Patient presents with  . Arm Pain    HPI [name] is a 46 y.o. male.  HPI   Patient is a 46 year old male with history of arthritis, hypertension, hyperlipidemia who presents to the ED with complaint of left elbow pain, onset 3 days. Patient reports having gradually worsening pain to his left elbow with mild associated swelling. He notes pain is worse with movement or palpation. Denies radiation. Denies any recent fall, trauma or injury. Patient states he hasn't taking Tylenol at home without relief. Denies fever, redness, warmth, numbness, tingling, weakness, neck pain. Patient denies any prior injuries to his left arm.  Past Medical History:  Diagnosis Date  . Arthritis   . BACK PAIN, CHRONIC 06/17/2007  . 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 Depression 04/19/2011  . HEMORRHOIDS, INTERNAL 06/17/2007  . Hyperlipidemia   . Hypertension   . Lumbar disc disease 03/19/2011  . RECTAL BLEEDING 06/17/2007    Patient Active Problem List   Diagnosis Date Noted  . Bronchitis 06/12/2012  . Abdominal pain, epigastric 10/04/2011  . GERD (gastroesophageal reflux disease) 08/11/2011  . Anxiety 07/06/2011  . Depression 04/19/2011  . Insomnia 04/19/2011  . Encounter for long-term (current) use of high-risk medication 04/19/2011  . Lumbar disc disease 03/19/2011  . Fatigue 03/19/2011  . Bladder neck obstruction 03/19/2011  . Smoker 03/19/2011  . Arthritis   . Hyperlipidemia   . Hypertension   . Preventative health care 03/16/2011  . HEMORRHOIDS, INTERNAL 06/17/2007  . RECTAL BLEEDING 06/17/2007  . BACK PAIN, CHRONIC 06/17/2007  . 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 BACK SURGERY  2006 and 2011   x 2; Dr Hassel Neth  . COLONOSCOPY    . POLYPECTOMY    . TONSILLECTOMY AND  ADENOIDECTOMY         Home Medications    Prior to Admission medications   Medication Sig Start Date End Date Taking? Authorizing Provider  cyclobenzaprine (FLEXERIL) 10 MG tablet Take 1 tablet (10 mg total) by mouth 3 (three) times daily as needed for muscle spasms. 08/24/16   Lawyer, Harrell Gave, PA-C  hydrochlorothiazide (MICROZIDE) 12.5 MG capsule Take 12.5 mg by mouth daily.    [provider]  ibuprofen (ADVIL,MOTRIN) 800 MG tablet Take 1 tablet (800 mg total) by mouth 3 (three) times daily. 03/20/14   Harvie Heck, PA-C  naproxen (NAPROSYN) 500 MG tablet Take 1 tablet (500 mg total) by mouth 2 (two) times daily. 11/28/16   Nona Dell, PA-C  oxyCODONE (ROXICODONE) 15 MG immediate release tablet Take 15 mg by mouth every 6 (six) hours as needed for pain.    [provider]  oxyCODONE-acetaminophen (PERCOCET/ROXICET) 5-325 MG tablet Take 1 tablet by mouth every 4 (four) hours as needed. 01/26/16   Larene Pickett, PA-C  predniSONE (DELTASONE) 50 MG tablet Take 1 tablet (50 mg total) by mouth daily. 08/24/16   Dalia Heading, PA-C    Family History Family History  Problem Relation Age of Onset  . Arthritis Mother   . Heart disease Mother   . Hypertension Mother   . Diabetes Mother   . Colon cancer Neg Hx   . Esophageal cancer Neg Hx   . Stomach cancer Neg Hx   . Rectal cancer Neg Hx     Social  History Social History  Substance Use Topics  . Smoking status: Current Every Day Smoker    Packs/day: 1.00    Years: 29.00    Types: Cigarettes  . Smokeless tobacco: Former Systems developer     Comment: Counseling sheet given in exam room   . Alcohol use Yes     Comment: social--twelve pack monthly      Allergies   Gadolinium derivatives; Contrast media [iodinated diagnostic agents]; Lipitor [atorvastatin]; Morphine and related; Norco [hydrocodone-acetaminophen]; Penicillins; Tramadol; and Valium   Review of Systems Review of Systems  Constitutional:  Negative for fever.  Musculoskeletal: Positive for arthralgias (left elbow) and joint swelling.  Skin: Negative for wound.  Neurological: Negative for weakness and numbness.     Physical Exam Updated Vital Signs BP 123/86 (BP Location: Right Arm)   Pulse 90   Temp 97.8 F (36.6 C) (Oral)   Resp 20   SpO2 98%   Physical Exam  Constitutional: He is oriented to person, place, and time. He appears well-developed and well-nourished. No distress.  HENT:  Head: Normocephalic and atraumatic.  Eyes: Conjunctivae and EOM are normal. Right eye exhibits no discharge. Left eye exhibits no discharge. No scleral icterus.  Neck: Normal range of motion. Neck supple.  Cardiovascular: Normal rate and intact distal pulses.   Pulmonary/Chest: Effort normal.  Musculoskeletal: He exhibits tenderness. He exhibits no edema or deformity.       Left shoulder: Normal.       Left elbow: He exhibits decreased range of motion (due to pain) and swelling. He exhibits no effusion, no deformity and no laceration. Tenderness found.       Left wrist: Normal.       Left upper arm: Normal.       Left forearm: Normal.       Left hand: Normal.  Mild swelling left lateral/posterior proximal forearm with point tenderness over lateral and medial epicondyles. Decreased active flexion and supination of left elbow due to reported pain. Pain with passive wrist extension with elbow fully extended. Full range of motion of left hand, wrist and shoulder with 5 out of 5 strength. Sensation grossly intact. 2+ radial pulse. Cap refill less than 2. No erythema, warmth, abrasion, ecchymoses or laceration present.  Neurological: He is alert and oriented to person, place, and time.  Skin: Skin is warm and dry. Capillary refill takes less than 2 seconds. He is not diaphoretic.  Nursing note and vitals reviewed.    ED Treatments / Results  Labs (all labs ordered are listed, but only abnormal results are displayed) Labs Reviewed - No  data to display  EKG  EKG Interpretation None       Radiology Dg Elbow Complete Left  Result Date: 11/28/2016 CLINICAL DATA:  Left elbow pain and swelling for 4 days. No known injury. EXAM: LEFT ELBOW - COMPLETE 3+ VIEW COMPARISON:  None. FINDINGS: There is no evidence of fracture, dislocation, or joint effusion. There is no evidence of arthropathy or other focal bone abnormality. Mild soft tissue swelling this noted posteriorly. No evidence of soft tissue gas or radiopaque foreign body. IMPRESSION: Mild posterior soft tissue swelling. No evidence of fracture or joint effusion. Electronically Signed   By: Earle Gell M.D.   On: 11/28/2016 11:02    Procedures Procedures (including critical care time)  Medications Ordered in ED Medications  ibuprofen (ADVIL,MOTRIN) tablet 600 mg (not administered)  HYDROcodone-acetaminophen (NORCO/VICODIN) 5-325 MG per tablet 1 tablet (not administered)     Initial Impression /  Assessment and Plan / ED Course  I have reviewed the triage vital signs and the nursing notes.  Pertinent labs & imaging results that were available during my care of the patient were reviewed by me and considered in my medical decision making (see chart for details).     Patient presents with left elbow pain medicine for that for the past 3 days with mild associated swelling. Denies any specific fall, trauma or injury. No relief with Tylenol taken at home. VSS. Exam revealed mild swelling over left proximal posterior elbow with diffuse tenderness, pain with passive wrist extension wall elbow and full extension. No erythema, warmth, joint effusion or bursitis present; no evidence of cellulitis, septic joint or abscess. Left elbow x-ray revealed mild posterior soft tissue swelling without fracture or joint effusion. Sick patient's symptoms are likely due to medial epicondylitis. Plan discharge patient home with them tonight treatment including NSAIDs, RICE protocol and advised  patient to purchase a counterforce brace over the counter. Advised patient to follow up with PCP in the next 1-2 weeks if symptoms have not improved. Discussed return precautions.  [place] Controlled Substance reporting System queried, website showed pt filled rx on 11/25/16 for 10mg  oxycodone #90.   Final Clinical Impressions(s) / ED Diagnoses   Final diagnoses:  Left elbow pain    New Prescriptions New Prescriptions   NAPROXEN (NAPROSYN) 500 MG TABLET    Take 1 tablet (500 mg total) by mouth 2 (two) times daily.     Nona Dell, PA-C 11/28/16 1132    Margette Fast, MD 11/29/16 1134

## 2016-11-28 NOTE — ED Triage Notes (Signed)
Pt reports left arm pain and swelling to elbow x 3-4 days. Denies injury. No obv swelling, injury or redness noted at triage.

## 2016-11-28 NOTE — Discharge Instructions (Signed)
Take your medication as prescribed. I recommend continuing to rest, elevate and apply ice to affected area for 15-20 minutes 3-4 times daily. I also recommend wearing a counter for sprays she can purchase over-the-counter for additional relief. Please follow up with a primary care provider from the Resource Guide provided below in 1-2 weeks if your symptoms have not improved. Please return to the Emergency Department if symptoms worsen or new onset of fever, redness, swelling, warmth, numbness, weakness, decreased range of motion.

## 2016-12-05 ENCOUNTER — Inpatient Hospital Stay: Admit: 2016-12-05 | Discharge: 2016-12-05 | Discharge status: Against Medical Advice | Payer: PRIVATE HEALTH INSURANCE

## 2016-12-05 DIAGNOSIS — H9201 Otalgia, right ear: Secondary | ICD-10-CM

## 2016-12-05 NOTE — Unmapped (Signed)
Patient presents to ER triage with c/o 7/10 right ear pain. He reports having crown placed to upper posterior tooth on tues. He had persistent pain, and seen PMD on Friday. He was started on abx. Pain has worsened, and now extends to right ear. He could not wait until mon to see oral surgeon.

## 2016-12-15 ENCOUNTER — Encounter (HOSPITAL_COMMUNITY): Payer: Self-pay | Admitting: Emergency Medicine

## 2016-12-15 ENCOUNTER — Emergency Department (HOSPITAL_COMMUNITY)
Admission: EM | Admit: 2016-12-15 | Discharge: 2016-12-16 | Disposition: A | Discharge status: Home / Self Care | Payer: Medicare Other | Attending: Emergency Medicine | Admitting: Emergency Medicine

## 2016-12-15 DIAGNOSIS — I10 Essential (primary) hypertension: Secondary | ICD-10-CM | POA: Insufficient documentation

## 2016-12-15 DIAGNOSIS — Y929 Unspecified place or not applicable: Secondary | ICD-10-CM | POA: Diagnosis not present

## 2016-12-15 DIAGNOSIS — F17210 Nicotine dependence, cigarettes, uncomplicated: Secondary | ICD-10-CM | POA: Insufficient documentation

## 2016-12-15 DIAGNOSIS — W57XXXA Bitten or stung by nonvenomous insect and other nonvenomous arthropods, initial encounter: Secondary | ICD-10-CM | POA: Insufficient documentation

## 2016-12-15 DIAGNOSIS — S80862A Insect bite (nonvenomous), left lower leg, initial encounter: Secondary | ICD-10-CM | POA: Diagnosis not present

## 2016-12-15 DIAGNOSIS — E785 Hyperlipidemia, unspecified: Secondary | ICD-10-CM | POA: Insufficient documentation

## 2016-12-15 DIAGNOSIS — Y939 Activity, unspecified: Secondary | ICD-10-CM | POA: Insufficient documentation

## 2016-12-15 DIAGNOSIS — Z79899 Other long term (current) drug therapy: Secondary | ICD-10-CM | POA: Insufficient documentation

## 2016-12-15 DIAGNOSIS — Z23 Encounter for immunization: Secondary | ICD-10-CM | POA: Insufficient documentation

## 2016-12-15 DIAGNOSIS — Y998 Other external cause status: Secondary | ICD-10-CM | POA: Insufficient documentation

## 2016-12-15 NOTE — ED Triage Notes (Signed)
Reports being bit on left lower leg by a mosquito.  Reports swelling at site and pain.  Small swollen area noted to left shin.

## 2016-12-15 NOTE — ED Provider Notes (Signed)
[facility] DEPT Provider Note   CSN: [id number] Arrival date & time: 12/15/16  2134 11:22 went to see patient but patient had not been placed in room.  By signing my name below, I, Margit Banda, attest that this documentation has been prepared under the direction and in the presence of [place]. Janit Bern, [place]. Electronically Signed: Margit Banda, ED Scribe. 12/16/16. 12:09 AM.  History   Chief Complaint Chief Complaint  Patient presents with  . Insect Bite    HPI [name] is a 46 y.o. male who presents to the Emergency Department complaining of an unknown insect bite to his lower left leg that occurred ~8:30 pm on 12/15/16. Pt reports he was sitting outside on his porch and felt something stinging on his leg and then had pain and swelling at site. Tetanus is NOT UTD. Pt denies fever and chills.   The history is provided by the patient. No language interpreter was used.    Past Medical History:  Diagnosis Date  . Arthritis   . BACK PAIN, CHRONIC 06/17/2007  . 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 Depression 04/19/2011  . HEMORRHOIDS, INTERNAL 06/17/2007  . Hyperlipidemia   . Hypertension   . Lumbar disc disease 03/19/2011  . RECTAL BLEEDING 06/17/2007    Patient Active Problem List   Diagnosis Date Noted  . Bronchitis 06/12/2012  . Abdominal pain, epigastric 10/04/2011  . GERD (gastroesophageal reflux disease) 08/11/2011  . Anxiety 07/06/2011  . Depression 04/19/2011  . Insomnia 04/19/2011  . Encounter for long-term (current) use of high-risk medication 04/19/2011  . Lumbar disc disease 03/19/2011  . Fatigue 03/19/2011  . Bladder neck obstruction 03/19/2011  . Smoker 03/19/2011  . Arthritis   . Hyperlipidemia   . Hypertension   . Preventative health care 03/16/2011  . HEMORRHOIDS, INTERNAL 06/17/2007  . RECTAL BLEEDING 06/17/2007  . BACK PAIN, CHRONIC 06/17/2007  . 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 BACK SURGERY  2006 and 2011   x 2; Dr Hassel Neth  . COLONOSCOPY    . POLYPECTOMY    . TONSILLECTOMY AND ADENOIDECTOMY         Home Medications    Prior to Admission medications   Medication Sig Start Date End Date Taking? Authorizing Provider  cyclobenzaprine (FLEXERIL) 10 MG tablet Take 1 tablet (10 mg total) by mouth 3 (three) times daily as needed for muscle spasms. 08/24/16   Lawyer, Harrell Gave, PA-C  hydrochlorothiazide (MICROZIDE) 12.5 MG capsule Take 12.5 mg by mouth daily.    [provider]  hydrOXYzine (ATARAX/VISTARIL) 25 MG tablet Take 1 tablet (25 mg total) by mouth every 6 (six) hours. 12/16/16   Ashley Murrain, NP  ibuprofen (ADVIL,MOTRIN) 800 MG tablet Take 1 tablet (800 mg total) by mouth 3 (three) times daily. 03/20/14   Harvie Heck, PA-C  naproxen (NAPROSYN) 500 MG tablet Take 1 tablet (500 mg total) by mouth 2 (two) times daily. 11/28/16   Nona Dell, PA-C  oxyCODONE (ROXICODONE) 15 MG immediate release tablet Take 15 mg by mouth every 6 (six) hours as needed for pain.    [provider]  oxyCODONE-acetaminophen (PERCOCET/ROXICET) 5-325 MG tablet Take 1 tablet by mouth every 4 (four) hours as needed. 01/26/16   Larene Pickett, PA-C  predniSONE (DELTASONE) 50 MG tablet Take 1 tablet (50 mg total) by mouth daily. 08/24/16   Lawyer, Harrell Gave, PA-C  ranitidine (ZANTAC) 150 MG tablet Take 1 tablet (150 mg  total) by mouth 2 (two) times daily. 12/16/16   Ashley Murrain, NP    Family History Family History  Problem Relation Age of Onset  . Arthritis Mother   . Heart disease Mother   . Hypertension Mother   . Diabetes Mother   . Colon cancer Neg Hx   . Esophageal cancer Neg Hx   . Stomach cancer Neg Hx   . Rectal cancer Neg Hx     Social History Social History  Substance Use Topics  . Smoking status: Current Every Day Smoker    Packs/day: 1.00    Years: 29.00    Types: Cigarettes  . Smokeless tobacco: Former Systems developer      Comment: Counseling sheet given in exam room   . Alcohol use Yes     Comment: social--twelve pack monthly      Allergies   Gadolinium derivatives; Contrast media [iodinated diagnostic agents]; Lipitor [atorvastatin]; Morphine and related; Norco [hydrocodone-acetaminophen]; Penicillins; Tramadol; and Valium   Review of Systems Review of Systems  Constitutional: Negative for chills and fever.  Gastrointestinal: Negative for abdominal pain, nausea and vomiting.  Musculoskeletal: Positive for arthralgias.  Skin: Positive for rash.     Physical Exam Updated Vital Signs BP 123/78 (BP Location: Left Arm)   Pulse 95   Temp 97.7 F (36.5 C) (Oral)   Resp 18   Ht 5\' 9"  (1.753 m)   Wt 80.7 kg (178 lb)   SpO2 100%   BMI 26.29 kg/m   Physical Exam  Constitutional: He is oriented to person, place, and time. He appears well-developed and well-nourished. No distress.  HENT:  Head: Normocephalic.  Eyes: EOM are normal.  Neck: Normal range of motion. Neck supple.  Cardiovascular: Normal rate.   Pulmonary/Chest: Effort normal.  Musculoskeletal: Normal range of motion.  Marland Kitchen5 cm mild erythematous area on the anterior aspect of left leg that is tender. No red streaking, minimal swelling.   Neurological: He is alert and oriented to person, place, and time. No cranial nerve deficit.  Skin: Skin is warm and dry.  Psychiatric: He has a normal mood and affect.  Nursing note and vitals reviewed.    ED Treatments / Results  DIAGNOSTIC STUDIES: Oxygen Saturation is 100% on RA, normal by my interpretation.   COORDINATION OF CARE: 12:09 AM-Discussed next steps with pt which includes pain medication and antibiotics. Pt is to return if sx worsen or change. Pt verbalized understanding and is agreeable with the plan.   Labs (all labs ordered are listed, but only abnormal results are displayed) Labs Reviewed - No data to display Radiology No results found.  Procedures Procedures (including  critical care time)  Medications Ordered in ED Medications  Tdap (BOOSTRIX) injection 0.5 mL (0.5 mLs Intramuscular Given 12/16/16 0035)  ibuprofen (ADVIL,MOTRIN) tablet 600 mg (600 mg Oral Given 12/16/16 0033)  diphenhydrAMINE (BENADRYL) capsule 25 mg (25 mg Oral Given 12/16/16 0033)  famotidine (PEPCID) tablet 20 mg (20 mg Oral Given 12/16/16 0033)     Initial Impression / Assessment and Plan / ED Course  I have reviewed the triage vital signs and the nursing notes.  Final Clinical Impressions(s) / ED Diagnoses  46 y.o. male with possible insect bite to the left lower leg stable for d/c without respiratory symptoms and no signs of infection. Return precautions discussed.  Final diagnoses:  Insect bite, initial encounter    New Prescriptions Discharge Medication List as of 12/16/2016 12:33 AM    START taking these medications  Details  hydrOXYzine (ATARAX/VISTARIL) 25 MG tablet Take 1 tablet (25 mg total) by mouth every 6 (six) hours., Starting Thu 12/16/2016, Print    ranitidine (ZANTAC) 150 MG tablet Take 1 tablet (150 mg total) by mouth 2 (two) times daily., Starting Thu 12/16/2016, Print       I personally performed the services described in this documentation, which was scribed in my presence. The recorded information has been reviewed and is accurate.     Debroah Baller [place], [place] 12/16/16 Aretha Parrot    Duffy Bruce, MD 12/16/16 [phone]

## 2016-12-16 DIAGNOSIS — S80862A Insect bite (nonvenomous), left lower leg, initial encounter: Secondary | ICD-10-CM | POA: Diagnosis not present

## 2016-12-16 MED ORDER — DIPHENHYDRAMINE HCL 25 MG PO CAPS
25.0000 mg | ORAL_CAPSULE | Freq: Once | ORAL | Status: AC
  Administered 2016-12-16: 25 mg via ORAL
  Filled 2016-12-16: qty 1

## 2016-12-16 MED ORDER — RANITIDINE HCL 150 MG PO TABS: 150.0000 mg | ORAL_TABLET | Freq: Two times a day (BID) | ORAL | 0 refills | Status: DC

## 2016-12-16 MED ORDER — FAMOTIDINE 20 MG PO TABS
20.0000 mg | ORAL_TABLET | Freq: Once | ORAL | Status: AC
  Administered 2016-12-16: 20 mg via ORAL
  Filled 2016-12-16: qty 1

## 2016-12-16 MED ORDER — HYDROXYZINE HCL 25 MG PO TABS: 25.0000 mg | ORAL_TABLET | Freq: Four times a day (QID) | ORAL | 0 refills | Status: DC

## 2016-12-16 MED ORDER — IBUPROFEN 400 MG PO TABS
600.0000 mg | ORAL_TABLET | Freq: Once | ORAL | Status: AC
  Administered 2016-12-16: 600 mg via ORAL
  Filled 2016-12-16: qty 1

## 2016-12-16 MED ORDER — TETANUS-DIPHTH-ACELL PERTUSSIS 5-2.5-18.5 LF-MCG/0.5 IM SUSP
0.5000 mL | Freq: Once | INTRAMUSCULAR | Status: AC
  Administered 2016-12-16: 0.5 mL via INTRAMUSCULAR
  Filled 2016-12-16: qty 0.5

## 2017-03-17 ENCOUNTER — Encounter (HOSPITAL_COMMUNITY): Payer: Self-pay | Admitting: Emergency Medicine

## 2017-03-17 ENCOUNTER — Emergency Department (HOSPITAL_COMMUNITY)
Admission: EM | Admit: 2017-03-17 | Discharge: 2017-03-17 | Disposition: A | Discharge status: Home / Self Care | Payer: Medicare Other | Attending: Emergency Medicine | Admitting: Emergency Medicine

## 2017-03-17 DIAGNOSIS — Y999 Unspecified external cause status: Secondary | ICD-10-CM | POA: Insufficient documentation

## 2017-03-17 DIAGNOSIS — F17210 Nicotine dependence, cigarettes, uncomplicated: Secondary | ICD-10-CM | POA: Diagnosis not present

## 2017-03-17 DIAGNOSIS — I10 Essential (primary) hypertension: Secondary | ICD-10-CM | POA: Diagnosis not present

## 2017-03-17 DIAGNOSIS — W260XXA Contact with knife, initial encounter: Secondary | ICD-10-CM | POA: Diagnosis not present

## 2017-03-17 DIAGNOSIS — Z23 Encounter for immunization: Secondary | ICD-10-CM | POA: Insufficient documentation

## 2017-03-17 DIAGNOSIS — S61512A Laceration without foreign body of left wrist, initial encounter: Secondary | ICD-10-CM | POA: Insufficient documentation

## 2017-03-17 DIAGNOSIS — Y939 Activity, unspecified: Secondary | ICD-10-CM | POA: Diagnosis not present

## 2017-03-17 DIAGNOSIS — Y929 Unspecified place or not applicable: Secondary | ICD-10-CM | POA: Diagnosis not present

## 2017-03-17 DIAGNOSIS — S6992XA Unspecified injury of left wrist, hand and finger(s), initial encounter: Secondary | ICD-10-CM | POA: Diagnosis present

## 2017-03-17 DIAGNOSIS — Z79899 Other long term (current) drug therapy: Secondary | ICD-10-CM | POA: Insufficient documentation

## 2017-03-17 MED ORDER — LIDOCAINE-EPINEPHRINE (PF) 2 %-1:200000 IJ SOLN
10.0000 mL | Freq: Once | INTRAMUSCULAR | Status: AC
  Administered 2017-03-17: 10 mL
  Filled 2017-03-17: qty 20

## 2017-03-17 MED ORDER — OXYCODONE-ACETAMINOPHEN 5-325 MG PO TABS
1.0000 | ORAL_TABLET | Freq: Once | ORAL | Status: AC
  Administered 2017-03-17: 1 via ORAL
  Filled 2017-03-17: qty 1

## 2017-03-17 MED ORDER — TETANUS-DIPHTH-ACELL PERTUSSIS 5-2.5-18.5 LF-MCG/0.5 IM SUSP
0.5000 mL | Freq: Once | INTRAMUSCULAR | Status: AC
  Administered 2017-03-17: 0.5 mL via INTRAMUSCULAR
  Filled 2017-03-17: qty 0.5

## 2017-03-17 NOTE — ED Triage Notes (Signed)
Pt reports cut to L lateral wrist last night with knife, states no idea when last tetanus shot was.  Bleeding controlled at this time.

## 2017-03-17 NOTE — ED Provider Notes (Signed)
[facility] DEPARTMENT Provider Note   CSN: [id number] Arrival date & time: 03/17/17  0756   History   Chief Complaint Chief Complaint  Patient presents with  . Laceration    HPI [name] is a 46 y.o. male.  HPI   Patient comes to the ER for complaints of laceration. He is not on blood thinners. He accidentally sustained a laceration with a knife around 10 pm last night. He cleaned it and covered it himself but when he woke up this morning it was still bleeding and causing pain so he came in for evaluation. He has not tried anything for pain. He is unsure when his last tetanus shot was. He does not have any numbness or weakness to the left hand.    Vitals:   03/17/17 0801  BP: 121/77  Pulse: 69  Resp: 20  Temp: 97.6 F (36.4 C)  SpO2: 99%     Past Medical History:  Diagnosis Date  . Arthritis   . BACK PAIN, CHRONIC 06/17/2007  . 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 Depression 04/19/2011  . HEMORRHOIDS, INTERNAL 06/17/2007  . Hyperlipidemia   . Hypertension   . Lumbar disc disease 03/19/2011  . RECTAL BLEEDING 06/17/2007    Patient Active Problem List   Diagnosis Date Noted  . Bronchitis 06/12/2012  . Abdominal pain, epigastric 10/04/2011  . GERD (gastroesophageal reflux disease) 08/11/2011  . Anxiety 07/06/2011  . Depression 04/19/2011  . Insomnia 04/19/2011  . Encounter for long-term (current) use of high-risk medication 04/19/2011  . Lumbar disc disease 03/19/2011  . Fatigue 03/19/2011  . Bladder neck obstruction 03/19/2011  . Smoker 03/19/2011  . Arthritis   . Hyperlipidemia   . Hypertension   . Preventative health care 03/16/2011  . HEMORRHOIDS, INTERNAL 06/17/2007  . RECTAL BLEEDING 06/17/2007  . BACK PAIN, CHRONIC 06/17/2007  . 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 BACK SURGERY  2006 and 2011   x 2; Dr Hassel Neth  . COLONOSCOPY    . POLYPECTOMY      . TONSILLECTOMY AND ADENOIDECTOMY         Home Medications    Prior to Admission medications   Medication Sig Start Date End Date Taking? Authorizing Provider  cyclobenzaprine (FLEXERIL) 10 MG tablet Take 1 tablet (10 mg total) by mouth 3 (three) times daily as needed for muscle spasms. 08/24/16   Lawyer, Harrell Gave, PA-C  hydrochlorothiazide (MICROZIDE) 12.5 MG capsule Take 12.5 mg by mouth daily.    [provider]  hydrOXYzine (ATARAX/VISTARIL) 25 MG tablet Take 1 tablet (25 mg total) by mouth every 6 (six) hours. 12/16/16   Ashley Murrain, NP  ibuprofen (ADVIL,MOTRIN) 800 MG tablet Take 1 tablet (800 mg total) by mouth 3 (three) times daily. 03/20/14   Harvie Heck, PA-C  naproxen (NAPROSYN) 500 MG tablet Take 1 tablet (500 mg total) by mouth 2 (two) times daily. 11/28/16   Nona Dell, PA-C  oxyCODONE (ROXICODONE) 15 MG immediate release tablet Take 15 mg by mouth every 6 (six) hours as needed for pain.    [provider]  oxyCODONE-acetaminophen (PERCOCET/ROXICET) 5-325 MG tablet Take 1 tablet by mouth every 4 (four) hours as needed. 01/26/16   Larene Pickett, PA-C  predniSONE (DELTASONE) 50 MG tablet Take 1 tablet (50 mg total) by mouth daily. 08/24/16   Lawyer, Harrell Gave, PA-C  ranitidine (ZANTAC) 150 MG tablet Take 1 tablet (150 mg total)  by mouth 2 (two) times daily. 12/16/16   Ashley Murrain, NP    Family History Family History  Problem Relation Age of Onset  . Arthritis Mother   . Heart disease Mother   . Hypertension Mother   . Diabetes Mother   . Colon cancer Neg Hx   . Esophageal cancer Neg Hx   . Stomach cancer Neg Hx   . Rectal cancer Neg Hx     Social History Social History  Substance Use Topics  . Smoking status: Current Every Day Smoker    Packs/day: 1.00    Years: 29.00    Types: Cigarettes  . Smokeless tobacco: Former Systems developer     Comment: Counseling sheet given in exam room   . Alcohol use Yes     Comment: social--twelve  pack monthly      Allergies   Gadolinium derivatives; Contrast media [iodinated diagnostic agents]; Ibuprofen; Lipitor [atorvastatin]; Morphine and related; Norco [hydrocodone-acetaminophen]; Penicillins; Tramadol; and Valium   Review of Systems Review of Systems  Negative ROS aside from pertinent positives and negatives as listed in HPI  Physical Exam Updated Vital Signs BP 121/77 (BP Location: Left Arm)   Pulse 69   Temp 97.6 F (36.4 C) (Oral)   Resp 20   SpO2 99%   Physical Exam  Constitutional: He appears well-developed and well-nourished. No distress.  HENT:  Head: Normocephalic and atraumatic.  Eyes: Pupils are equal, round, and reactive to light.  Neck: Normal range of motion. Neck supple.  Cardiovascular: Normal rate and regular rhythm.   Pulmonary/Chest: Effort normal.  Abdominal: Soft.  Neurological: He is alert.  Skin: Skin is warm and dry.  Left posterior wrist shows a 4 cm laceration that extends just through the epidermis. The edges are smooth, clean and it is not actively bleeding. He has full sensation to all five fingers as well as physiologic grip strength. Strong radial pusle.  Nursing note and vitals reviewed.    ED Treatments / Results  Labs (all labs ordered are listed, but only abnormal results are displayed) Labs Reviewed - No data to display  EKG  EKG Interpretation None       Radiology No results found.  Procedures Procedures (including critical care time)  Medications Ordered in ED Medications  lidocaine-EPINEPHrine (XYLOCAINE W/EPI) 2 %-1:200000 (PF) injection 10 mL (not administered)  Tdap (BOOSTRIX) injection 0.5 mL (not administered)  oxyCODONE-acetaminophen (PERCOCET/ROXICET) 5-325 MG per tablet 1 tablet (1 tablet Oral Given 03/17/17 0951)     Initial Impression / Assessment and Plan / ED Course  I have reviewed the triage vital signs and the nursing notes.  Pertinent labs & imaging results that were available during  my care of the patient were reviewed by me and considered in my medical decision making (see chart for details).     The wound has been open for a prolonged period of time, however, it is overlying and area of tension. Joint and making was used to discuss letting the wound closed by secondary intent or approximating the edges with a couple stitches. The patient prefers to have the wound edges approximated and understands the higher risk of infection. Tetanus updated.  LACERATION REPAIR Performed by: Linus Mako Authorized by: Linus Mako Consent: Verbal consent obtained. Risks and benefits: risks, benefits and alternatives were discussed Consent given by: patient Patient identity confirmed: provided demographic data Prepped and Draped in normal sterile fashion Wound explored  Laceration Location: left wrist  Laceration Length: 4 cm  No  Foreign Bodies seen or palpated  Anesthesia: local infiltration  Local anesthetic: lidocaine 1% with epinephrine  Anesthetic total: 4 ml  Irrigation method: syringe Amount of cleaning: standard  Skin closure: sutures  Number of sutures: 2  Technique: simple interrupted, loose approximation of edges  Patient tolerance: Patient tolerated the procedure well with no immediate complications.  The patient is requesting pain medication and since Tylenol will not help. He however is allergic to ibuprofen, tramadol, Vicodin, morphine and related. Was given a percocet for pain here but I declined writing a prescription for this. Discussed wound care.  Blood pressure 121/77, pulse 69, temperature 97.6 F (36.4 C), temperature source Oral, resp. rate 20, SpO2 99 %.  [name] has been evaluated today in the emergency department. The appropriate screening and testing was been performed and I believe the patient to be medically stable for discharge.   Return signs and symptoms have been discussed with the patient and/or caregivers and they  have voiced their understanding. The patient has agreed to follow-up with their primary care provider or the referred specialist.     Final Clinical Impressions(s) / ED Diagnoses   Final diagnoses:  Wrist laceration, left, initial encounter    New Prescriptions New Prescriptions   No medications on file     Delos Haring, PA-C 03/17/17 0321    Nat Christen, MD 03/19/17 1137

## 2017-10-14 ENCOUNTER — Encounter (HOSPITAL_COMMUNITY): Payer: Self-pay

## 2017-10-14 ENCOUNTER — Other Ambulatory Visit: Payer: Self-pay

## 2017-10-14 ENCOUNTER — Emergency Department (HOSPITAL_COMMUNITY): Payer: Medicare HMO

## 2017-10-14 ENCOUNTER — Emergency Department (HOSPITAL_COMMUNITY)
Admission: EM | Admit: 2017-10-14 | Discharge: 2017-10-14 | Disposition: A | Discharge status: Home / Self Care | Payer: Medicare HMO | Attending: Emergency Medicine | Admitting: Emergency Medicine

## 2017-10-14 DIAGNOSIS — Z79899 Other long term (current) drug therapy: Secondary | ICD-10-CM | POA: Insufficient documentation

## 2017-10-14 DIAGNOSIS — Y999 Unspecified external cause status: Secondary | ICD-10-CM | POA: Insufficient documentation

## 2017-10-14 DIAGNOSIS — F17210 Nicotine dependence, cigarettes, uncomplicated: Secondary | ICD-10-CM | POA: Diagnosis not present

## 2017-10-14 DIAGNOSIS — I10 Essential (primary) hypertension: Secondary | ICD-10-CM | POA: Diagnosis not present

## 2017-10-14 DIAGNOSIS — Y939 Activity, unspecified: Secondary | ICD-10-CM | POA: Diagnosis not present

## 2017-10-14 DIAGNOSIS — S59901A Unspecified injury of right elbow, initial encounter: Secondary | ICD-10-CM | POA: Diagnosis not present

## 2017-10-14 DIAGNOSIS — W1789XA Other fall from one level to another, initial encounter: Secondary | ICD-10-CM | POA: Diagnosis not present

## 2017-10-14 DIAGNOSIS — Y929 Unspecified place or not applicable: Secondary | ICD-10-CM | POA: Diagnosis not present

## 2017-10-14 MED ORDER — OXYCODONE-ACETAMINOPHEN 5-325 MG PO TABS: 1.0000 | ORAL_TABLET | Freq: Four times a day (QID) | ORAL | 0 refills | Status: DC | PRN

## 2017-10-14 MED ORDER — OXYCODONE-ACETAMINOPHEN 5-325 MG PO TABS
1.0000 | ORAL_TABLET | Freq: Once | ORAL | Status: AC
  Administered 2017-10-14: 1 via ORAL
  Filled 2017-10-14: qty 1

## 2017-10-14 NOTE — Discharge Instructions (Signed)
You have been evaluated for a recent injury.  It is possible that you may have a broken radial head injury in your right elbow.  Please wear sling at all time, and follow up with hand specialist for a repeat right elbow xray in 1 week.

## 2017-10-14 NOTE — ED Triage Notes (Signed)
Pt states that he fell off of a ramp about an hour ago, landed on right arm on concrete. No deformity noted at this time. No bleeding at this time. Denies hitting head, denies LOC. Pt reports he felt a crack when he went down on his right arm.

## 2017-10-14 NOTE — ED Provider Notes (Signed)
[facility] EMERGENCY DEPARTMENT Provider Note   CSN: [id number] Arrival date & time: 10/14/17  1745     History   Chief Complaint Chief Complaint  Patient presents with  . Fall    HPI [name] is a 47 y.o. male.  HPI   47 year old male presenting complaining of right arm injury.  Patient report approximately 5 hours ago, he was helping his daughter moved.  While he was standing on a ramp of a truck, he fell down, and reach out his right arm to break the fall.  He felt a crack as he fell down and experienced immediate sharp pain to his right forearm extending towards the right elbow.  Pain is rated as 10 out of 10, worsening with movement.  He denies hitting his head or loss of consciousness.  No complaint of shoulder pain.  He is right arm dominant.  No specific treatment tried.  Past Medical History:  Diagnosis Date  . Arthritis   . BACK PAIN, CHRONIC 06/17/2007  . 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 Depression 04/19/2011  . HEMORRHOIDS, INTERNAL 06/17/2007  . Hyperlipidemia   . Hypertension   . Lumbar disc disease 03/19/2011  . RECTAL BLEEDING 06/17/2007    Patient Active Problem List   Diagnosis Date Noted  . Bronchitis 06/12/2012  . Abdominal pain, epigastric 10/04/2011  . GERD (gastroesophageal reflux disease) 08/11/2011  . Anxiety 07/06/2011  . Depression 04/19/2011  . Insomnia 04/19/2011  . Encounter for long-term (current) use of high-risk medication 04/19/2011  . Lumbar disc disease 03/19/2011  . Fatigue 03/19/2011  . Bladder neck obstruction 03/19/2011  . Smoker 03/19/2011  . Arthritis   . Hyperlipidemia   . Hypertension   . Preventative health care 03/16/2011  . HEMORRHOIDS, INTERNAL 06/17/2007  . RECTAL BLEEDING 06/17/2007  . BACK PAIN, CHRONIC 06/17/2007  . 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 BACK SURGERY  2006 and 2011   x 2; Dr Hassel Neth  . COLONOSCOPY      . POLYPECTOMY    . TONSILLECTOMY AND ADENOIDECTOMY          Home Medications    Prior to Admission medications   Medication Sig Start Date End Date Taking? Authorizing Provider  cyclobenzaprine (FLEXERIL) 10 MG tablet Take 1 tablet (10 mg total) by mouth 3 (three) times daily as needed for muscle spasms. Patient not taking: Reported on 03/17/2017 08/24/16   Dalia Heading, PA-C  hydrochlorothiazide (MICROZIDE) 12.5 MG capsule Take 12.5 mg by mouth daily.    [provider]  hydrOXYzine (ATARAX/VISTARIL) 25 MG tablet Take 1 tablet (25 mg total) by mouth every 6 (six) hours. 12/16/16   Ashley Murrain, NP  ibuprofen (ADVIL,MOTRIN) 800 MG tablet Take 1 tablet (800 mg total) by mouth 3 (three) times daily. Patient not taking: Reported on 03/17/2017 03/20/14   Harvie Heck, PA-C  oxyCODONE-acetaminophen (PERCOCET/ROXICET) 5-325 MG tablet Take 1 tablet by mouth every 4 (four) hours as needed. Patient not taking: Reported on 03/17/2017 01/26/16   Larene Pickett, PA-C  predniSONE (DELTASONE) 50 MG tablet Take 1 tablet (50 mg total) by mouth daily. Patient not taking: Reported on 03/17/2017 08/24/16   Dalia Heading, PA-C  ranitidine (ZANTAC) 150 MG tablet Take 1 tablet (150 mg total) by mouth 2 (two) times daily. Patient not taking: Reported on 03/17/2017 12/16/16   Ashley Murrain, NP    Family History Family History  Problem Relation Age  of Onset  . Arthritis Mother   . Heart disease Mother   . Hypertension Mother   . Diabetes Mother   . Colon cancer Neg Hx   . Esophageal cancer Neg Hx   . Stomach cancer Neg Hx   . Rectal cancer Neg Hx     Social History Social History   Tobacco Use  . Smoking status: Current Every Day Smoker    Packs/day: 1.00    Years: 29.00    Pack years: 29.00    Types: Cigarettes  . Smokeless tobacco: Former Systems developer  . Tobacco comment: Counseling sheet given in exam room   Substance Use Topics  . Alcohol use: Yes    Comment:  social--twelve pack monthly   . Drug use: No     Allergies   Gadolinium derivatives; Contrast media [iodinated diagnostic agents]; Ibuprofen; Lipitor [atorvastatin]; Morphine and related; Norco [hydrocodone-acetaminophen]; Penicillins; Tramadol; and Valium   Review of Systems Review of Systems  Musculoskeletal: Positive for arthralgias.  Skin: Negative for wound.  Neurological: Negative for numbness.     Physical Exam Updated Vital Signs BP 130/84 (BP Location: Left Arm)   Pulse 98   Temp 98.1 F (36.7 C) (Oral)   Resp 18   Ht 5\' 9"  (1.753 m)   Wt 88 kg (194 lb)   SpO2 99%   BMI 28.65 kg/m   Physical Exam  Constitutional: He appears well-developed and well-nourished. No distress.  HENT:  Head: Atraumatic.  Eyes: Conjunctivae are normal.  Neck: Neck supple.  Musculoskeletal: He exhibits tenderness (Right arm: Tenderness to the right posterior elbow and along the right forearm on gentle palpation with mild swelling noted to the epicondyles of the right elbow but no bruising.  Decreased elbow flexion and extension secondary to pain.  Normal right wrist).  Neurological: He is alert.  Skin: No rash noted.  Psychiatric: He has a normal mood and affect.  Nursing note and vitals reviewed.    ED Treatments / Results  Labs (all labs ordered are listed, but only abnormal results are displayed) Labs Reviewed - No data to display  EKG None  Radiology Dg Elbow Complete Right  Result Date: 10/14/2017 CLINICAL DATA:  Fall, RIGHT arm pain. EXAM: RIGHT ELBOW - COMPLETE 3+ VIEW COMPARISON:  None. FINDINGS: On the RIGHT forearm exam, there was suggestion of a nondisplaced fracture line at the RIGHT radial head. This is not confirmed on the elbow images. Furthermore, there is no evidence of joint effusion seen to suggest intra-articular fracture. Proximal ulna appears intact and normally aligned. Distal humerus appears intact and normally aligned. IMPRESSION: 1. The suspected  nondisplaced fracture at the RIGHT radial head (as seen on the RIGHT forearm exam) is not confirmed on these 4 images of the RIGHT elbow. 2. No fracture or dislocation seen on these images. No evidence of joint effusion. Electronically Signed   By: Franki Cabot M.D.   On: 10/14/2017 19:53   Dg Forearm Right  Result Date: 10/14/2017 CLINICAL DATA:  Fall, RIGHT arm pain. EXAM: RIGHT FOREARM - 2 VIEW COMPARISON:  None. FINDINGS: Subtle lucency at the RIGHT radial head, suspicious for nondisplaced fracture. Remainder of the radius appears intact and normally aligned. Ulna appears intact and normally aligned. IMPRESSION: Questionable nondisplaced fracture at the RIGHT radial head. Electronically Signed   By: Franki Cabot M.D.   On: 10/14/2017 19:51   Dg Wrist Complete Right  Result Date: 10/14/2017 CLINICAL DATA:  Fall, RIGHT arm pain. EXAM: RIGHT WRIST - COMPLETE  3+ VIEW COMPARISON:  None. FINDINGS: There is no evidence of fracture or dislocation. There is no evidence of arthropathy or other focal bone abnormality. Soft tissues are unremarkable. IMPRESSION: Negative. Electronically Signed   By: Franki Cabot M.D.   On: 10/14/2017 19:54    Procedures Procedures (including critical care time)  Medications Ordered in ED Medications - No data to display   Initial Impression / Assessment and Plan / ED Course  I have reviewed the triage vital signs and the nursing notes.  Pertinent labs & imaging results that were available during my care of the patient were reviewed by me and considered in my medical decision making (see chart for details).     BP 130/84 (BP Location: Left Arm)   Pulse 98   Temp 98.1 F (36.7 C) (Oral)   Resp 18   Ht 5\' 9"  (1.753 m)   Wt 88 kg (194 lb)   SpO2 99%   BMI 28.65 kg/m    Final Clinical Impressions(s) / ED Diagnoses   Final diagnoses:  Elbow injury, right, initial encounter    ED Discharge Orders        Ordered    oxyCODONE-acetaminophen  (PERCOCET/ROXICET) 5-325 MG tablet  Every 6 hours PRN     10/14/17 2046     8:41 PM Patient had a mechanical fall and fell down 6 feet.  His primary complaint is pain to his right elbow and forearm.  X-ray of the elbow and forearm demonstrate a questionable nondisplaced fracture of the right radial head on the forearm x-ray however his right elbow x-ray is unremarkable.  Given patient's localized pain and a potential for acute fracture, patient will be placed in a sling, pain medication given, patient will follow-up with hand specialist for a repeat x-ray and reevaluation in a week.  He is neurovascularly intact.  Patient report he is not allergic to Percocet. In order to decrease risk of narcotic abuse. Pt's record were checked using the [place] Controlled Substance database. Pt is at high risk for drug abuse.  Will give a small amount of opiate only.      Domenic Moras, PA-C 10/14/17 4315    Sherwood Gambler, MD 10/14/17 2103

## 2017-10-16 ENCOUNTER — Encounter (HOSPITAL_COMMUNITY): Payer: Self-pay

## 2017-10-16 ENCOUNTER — Emergency Department (HOSPITAL_COMMUNITY)
Admission: EM | Admit: 2017-10-16 | Discharge: 2017-10-16 | Disposition: A | Discharge status: Home / Self Care | Payer: Medicare HMO | Attending: Emergency Medicine | Admitting: Emergency Medicine

## 2017-10-16 DIAGNOSIS — Z79899 Other long term (current) drug therapy: Secondary | ICD-10-CM | POA: Diagnosis not present

## 2017-10-16 DIAGNOSIS — F17210 Nicotine dependence, cigarettes, uncomplicated: Secondary | ICD-10-CM | POA: Diagnosis not present

## 2017-10-16 DIAGNOSIS — M79601 Pain in right arm: Secondary | ICD-10-CM | POA: Diagnosis present

## 2017-10-16 DIAGNOSIS — I10 Essential (primary) hypertension: Secondary | ICD-10-CM | POA: Diagnosis not present

## 2017-10-16 MED ORDER — IBUPROFEN 400 MG PO TABS: 400.0000 mg | ORAL_TABLET | Freq: Four times a day (QID) | ORAL | 0 refills | Status: DC | PRN

## 2017-10-16 MED ORDER — ACETAMINOPHEN 325 MG PO TABS
650.0000 mg | ORAL_TABLET | Freq: Once | ORAL | Status: AC
  Administered 2017-10-16: 650 mg via ORAL
  Filled 2017-10-16: qty 2

## 2017-10-16 MED ORDER — ACETAMINOPHEN 325 MG PO TABS: 650.0000 mg | ORAL_TABLET | Freq: Four times a day (QID) | ORAL | 0 refills | Status: DC | PRN

## 2017-10-16 NOTE — Discharge Instructions (Addendum)
You may alternate taking Tylenol and Ibuprofen as needed for pain control. You may take 400-600 mg of ibuprofen every 6 hours and [phone] mg of Tylenol every 6 hours. Do not exceed 4000 mg of Tylenol daily as this can lead to liver damage. Also, make sure to take Ibuprofen with meals as it can cause an upset stomach. Do not take other NSAIDs while taking Ibuprofen such as (Aleve, Naprosyn, Aspirin, Celebrex, etc) and do not take more than the prescribed dose as this can lead to ulcers and bleeding in your GI tract. You may use warm and cold compresses to help with your symptoms.   Please follow up with your primary doctor within the next 7-10 days for re-evaluation and further treatment of your symptoms.  Follow-up with the orthopedic doctor that you were referred to prior visit for reevaluation.  Please return to the ER sooner if you have any new or worsening symptoms.

## 2017-10-16 NOTE — ED Provider Notes (Signed)
[facility] Provider Note   CSN: [id number] Arrival date & time: 10/16/17  1542     History   Chief Complaint Chief Complaint  Patient presents with  . Arm Pain    HPI [name] is a 47 y.o. male.  HPI  Patient is a 47 year old male with history of chronic back pain who presents the ED today complaining of right elbow and right wrist pain that has been ongoing since he injured his wrist on 5/17.  He was seen at that time was diagnosed with a suspected nondisplaced fracture of the right radial head.  He also had a wrist x-ray which was negative for acute fracture abnormality.  He was placed in a sling and advised to follow-up with the orthopedic doctor.  He is also given a short course of Percocet for his pain.  He is presenting today complaining of increased pain to the elbow and forearm.  He is requesting more pain medications.  He denies any new injuries. patient tells me that he is to see pain management however he has not been seen by pain management the last 5 to 6 years.  He states that he has not taken any narcotic pain medication in this time period as well.  Upon review of the posterolateral narcotic database appears the patient has received prescriptions for oxycodone 10 monthly for the last 2 years by Dr. York Ram.  His last 30-day prescription was filled on 09/26/2017.  Past Medical History:  Diagnosis Date  . Arthritis   . BACK PAIN, CHRONIC 06/17/2007  . 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 Depression 04/19/2011  . HEMORRHOIDS, INTERNAL 06/17/2007  . Hyperlipidemia   . Hypertension   . Lumbar disc disease 03/19/2011  . RECTAL BLEEDING 06/17/2007    Patient Active Problem List   Diagnosis Date Noted  . Bronchitis 06/12/2012  . Abdominal pain, epigastric 10/04/2011  . GERD (gastroesophageal reflux disease) 08/11/2011  . Anxiety 07/06/2011  . Depression 04/19/2011  . Insomnia 04/19/2011  . Encounter for  long-term (current) use of high-risk medication 04/19/2011  . Lumbar disc disease 03/19/2011  . Fatigue 03/19/2011  . Bladder neck obstruction 03/19/2011  . Smoker 03/19/2011  . Arthritis   . Hyperlipidemia   . Hypertension   . Preventative health care 03/16/2011  . HEMORRHOIDS, INTERNAL 06/17/2007  . RECTAL BLEEDING 06/17/2007  . BACK PAIN, CHRONIC 06/17/2007  . 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 BACK SURGERY  2006 and 2011   x 2; Dr Hassel Neth  . COLONOSCOPY    . POLYPECTOMY    . TONSILLECTOMY AND ADENOIDECTOMY          Home Medications    Prior to Admission medications   Medication Sig Start Date End Date Taking? Authorizing Provider  acetaminophen (TYLENOL) 325 MG tablet Take 2 tablets (650 mg total) by mouth every 6 (six) hours as needed. Do not take more than 4000mg  of tylenol per day 10/16/17   Kenzie Flakes S, PA-C  cyclobenzaprine (FLEXERIL) 10 MG tablet Take 1 tablet (10 mg total) by mouth 3 (three) times daily as needed for muscle spasms. Patient not taking: Reported on 03/17/2017 08/24/16   Dalia Heading, PA-C  hydrochlorothiazide (MICROZIDE) 12.5 MG capsule Take 12.5 mg by mouth daily.    [provider]  hydrOXYzine (ATARAX/VISTARIL) 25 MG tablet Take 1 tablet (25 mg total) by mouth every 6 (six) hours. 12/16/16   Debroah Baller  M, NP  ibuprofen (ADVIL,MOTRIN) 400 MG tablet Take 1 tablet (400 mg total) by mouth every 6 (six) hours as needed. 10/16/17   Toniesha Zellner S, PA-C  oxyCODONE-acetaminophen (PERCOCET/ROXICET) 5-325 MG tablet Take 1 tablet by mouth every 6 (six) hours as needed for severe pain. 10/14/17   Domenic Moras, PA-C  predniSONE (DELTASONE) 50 MG tablet Take 1 tablet (50 mg total) by mouth daily. Patient not taking: Reported on 03/17/2017 08/24/16   Dalia Heading, PA-C  ranitidine (ZANTAC) 150 MG tablet Take 1 tablet (150 mg total) by mouth 2 (two) times daily. Patient not  taking: Reported on 03/17/2017 12/16/16   Ashley Murrain, NP    Family History Family History  Problem Relation Age of Onset  . Arthritis Mother   . Heart disease Mother   . Hypertension Mother   . Diabetes Mother   . Colon cancer Neg Hx   . Esophageal cancer Neg Hx   . Stomach cancer Neg Hx   . Rectal cancer Neg Hx     Social History Social History   Tobacco Use  . Smoking status: Current Every Day Smoker    Packs/day: 1.00    Years: 29.00    Pack years: 29.00    Types: Cigarettes  . Smokeless tobacco: Former Systems developer  . Tobacco comment: Counseling sheet given in exam room   Substance Use Topics  . Alcohol use: Yes    Comment: social--twelve pack monthly   . Drug use: No     Allergies   Gadolinium derivatives; Contrast media [iodinated diagnostic agents]; Ibuprofen; Lipitor [atorvastatin]; Morphine and related; Norco [hydrocodone-acetaminophen]; Penicillins; Tramadol; and Valium   Review of Systems Review of Systems  Constitutional: Negative for fever.  Musculoskeletal:       Arm pain  Neurological: Negative for weakness and numbness.     Physical Exam Updated Vital Signs BP 110/82 (BP Location: Left Arm)   Pulse 90   Temp 98.4 F (36.9 C) (Oral)   Resp 16   SpO2 99%   Physical Exam  Constitutional: He appears well-developed and well-nourished. No distress.  Nontoxic appearing  HENT:  Head: Normocephalic and atraumatic.  Eyes: Conjunctivae are normal.  Neck: Neck supple.  Cardiovascular: Normal rate and regular rhythm.  No murmur heard. Pulmonary/Chest: Effort normal and breath sounds normal. No respiratory distress.  Abdominal: Soft. There is no tenderness.  Musculoskeletal: He exhibits no edema.  Diffuse tenderness along the right forearm.  Patient has decreased grip strength on the right due to pain.  His distal pulses are intact.  Distal sensation is intact with cap refill.  No evidence of compartment syndrome.  No swelling.  Neurological: He is  alert.  Skin: Skin is warm and dry.  Psychiatric: He has a normal mood and affect.  Nursing note and vitals reviewed.   ED Treatments / Results  Labs (all labs ordered are listed, but only abnormal results are displayed) Labs Reviewed - No data to display  EKG None  Radiology Dg Elbow Complete Right  Result Date: 10/14/2017 CLINICAL DATA:  Fall, RIGHT arm pain. EXAM: RIGHT ELBOW - COMPLETE 3+ VIEW COMPARISON:  None. FINDINGS: On the RIGHT forearm exam, there was suggestion of a nondisplaced fracture line at the RIGHT radial head. This is not confirmed on the elbow images. Furthermore, there is no evidence of joint effusion seen to suggest intra-articular fracture. Proximal ulna appears intact and normally aligned. Distal humerus appears intact and normally aligned. IMPRESSION: 1. The suspected nondisplaced fracture at the  RIGHT radial head (as seen on the RIGHT forearm exam) is not confirmed on these 4 images of the RIGHT elbow. 2. No fracture or dislocation seen on these images. No evidence of joint effusion. Electronically Signed   By: Franki Cabot M.D.   On: 10/14/2017 19:53   Dg Forearm Right  Result Date: 10/14/2017 CLINICAL DATA:  Fall, RIGHT arm pain. EXAM: RIGHT FOREARM - 2 VIEW COMPARISON:  None. FINDINGS: Subtle lucency at the RIGHT radial head, suspicious for nondisplaced fracture. Remainder of the radius appears intact and normally aligned. Ulna appears intact and normally aligned. IMPRESSION: Questionable nondisplaced fracture at the RIGHT radial head. Electronically Signed   By: Franki Cabot M.D.   On: 10/14/2017 19:51   Dg Wrist Complete Right  Result Date: 10/14/2017 CLINICAL DATA:  Fall, RIGHT arm pain. EXAM: RIGHT WRIST - COMPLETE 3+ VIEW COMPARISON:  None. FINDINGS: There is no evidence of fracture or dislocation. There is no evidence of arthropathy or other focal bone abnormality. Soft tissues are unremarkable. IMPRESSION: Negative. Electronically Signed   By: Franki Cabot M.D.   On: 10/14/2017 19:54    Procedures Procedures (including critical care time)  Medications Ordered in ED Medications  acetaminophen (TYLENOL) tablet 650 mg (has no administration in time range)     Initial Impression / Assessment and Plan / ED Course  I have reviewed the triage vital signs and the nursing notes.  Pertinent labs & imaging results that were available during my care of the patient were reviewed by me and considered in my medical decision making (see chart for details).    He is presenting today complaining of increased pain to the elbow and forearm.  He is requesting more pain medications.  Patient tells me that he is to see pain management however he has not been seen by pain management the last 5 to 6 years.  He states that he has not taken any narcotic pain medication in this time period as well.  Upon review of the posterolateral narcotic database appears the patient has received prescriptions for oxycodone 10 monthly for the last 2 years by Dr. York Ram.  His last 30-day prescription was filled on 09/26/2017.  Because of the discrepancy in the patient's story I would not be prescribing any more narcotics for him during this visit.  I advised him to follow-up with orthopedics as he was pretty directed to.  Advised him to return to the ER for any new or worsening symptoms.   Final Clinical Impressions(s) / ED Diagnoses   Final diagnoses:  Pain of right upper extremity   47 year old male presenting with right forearm pain after he was diagnosed with a suspected radial head fracture 2 days ago.  Was given short course of Percocet at that time.  He states that his pain has not improved since onset and he is wondering why he is not in a splint or cast.  Discussed that sling is appropriate treatment for this type of fracture and that he will need to follow-up with orthopedic doctor treatment of his fracture.  Discussed that I will not be prescribing any more  narcotics from the emergency department and that he can treat his symptoms with Tylenol ibuprofen, and rice protocol at home.  Advised on strict return precautions for any new or worsening symptoms.  All questions answered and patient understands return immediately to the ED.  ED Discharge Orders        Ordered    acetaminophen (TYLENOL) 325  MG tablet  Every 6 hours PRN     10/16/17 1746    ibuprofen (ADVIL,MOTRIN) 400 MG tablet  Every 6 hours PRN     10/16/17 1746       Crytal Pensinger S, PA-C 10/16/17 1750    Fredia Sorrow, MD 10/20/17 [phone]

## 2017-10-16 NOTE — ED Triage Notes (Signed)
Pt seen 5-17 for right arm injury.  Pt is in sling.  Was prescribed Percocet, Pt is out of medication.  Pt here for increased pain.

## 2017-10-16 NOTE — ED Notes (Signed)
Patient called for room, patient outside smoking.  Will call again.

## 2018-08-08 ENCOUNTER — Emergency Department (HOSPITAL_COMMUNITY)
Admission: EM | Admit: 2018-08-08 | Discharge: 2018-08-08 | Disposition: A | Discharge status: Home / Self Care | Payer: Medicare HMO | Attending: Emergency Medicine | Admitting: Emergency Medicine

## 2018-08-08 ENCOUNTER — Encounter (HOSPITAL_COMMUNITY): Payer: Self-pay | Admitting: Emergency Medicine

## 2018-08-08 ENCOUNTER — Other Ambulatory Visit: Payer: Self-pay

## 2018-08-08 DIAGNOSIS — M5442 Lumbago with sciatica, left side: Secondary | ICD-10-CM | POA: Diagnosis not present

## 2018-08-08 DIAGNOSIS — I10 Essential (primary) hypertension: Secondary | ICD-10-CM | POA: Insufficient documentation

## 2018-08-08 DIAGNOSIS — E785 Hyperlipidemia, unspecified: Secondary | ICD-10-CM | POA: Diagnosis not present

## 2018-08-08 DIAGNOSIS — F17210 Nicotine dependence, cigarettes, uncomplicated: Secondary | ICD-10-CM | POA: Insufficient documentation

## 2018-08-08 DIAGNOSIS — Z79899 Other long term (current) drug therapy: Secondary | ICD-10-CM | POA: Diagnosis not present

## 2018-08-08 DIAGNOSIS — M545 Low back pain: Secondary | ICD-10-CM | POA: Diagnosis present

## 2018-08-08 DIAGNOSIS — G8929 Other chronic pain: Secondary | ICD-10-CM

## 2018-08-08 MED ORDER — LIDOCAINE 5 % EX PTCH
1.0000 | MEDICATED_PATCH | CUTANEOUS | Status: DC
  Administered 2018-08-08: 1 via TRANSDERMAL
  Filled 2018-08-08: qty 1

## 2018-08-08 MED ORDER — OXYCODONE-ACETAMINOPHEN 5-325 MG PO TABS
1.0000 | ORAL_TABLET | Freq: Once | ORAL | Status: AC
  Administered 2018-08-08: 1 via ORAL
  Filled 2018-08-08: qty 1

## 2018-08-08 MED ORDER — KETOROLAC TROMETHAMINE 15 MG/ML IJ SOLN
15.0000 mg | Freq: Once | INTRAMUSCULAR | Status: AC
  Administered 2018-08-08: 15 mg via INTRAMUSCULAR
  Filled 2018-08-08: qty 1

## 2018-08-08 MED ORDER — METHOCARBAMOL 500 MG PO TABS: 500.0000 mg | ORAL_TABLET | Freq: Three times a day (TID) | ORAL | 0 refills | Status: DC

## 2018-08-08 MED ORDER — PREDNISONE 10 MG PO TABS: 40.0000 mg | ORAL_TABLET | Freq: Every day | ORAL | 0 refills | Status: AC

## 2018-08-08 MED ORDER — LIDOCAINE 5 % EX PTCH: 1.0000 | MEDICATED_PATCH | CUTANEOUS | 0 refills | Status: DC

## 2018-08-08 NOTE — Discharge Instructions (Signed)
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  At this time there does not appear to be the presence of an emergent medical condition, however there is always the potential for conditions to change. Please read and follow the below instructions.  Please return to the Emergency Department immediately for any new or worsening symptoms. Please be sure to follow up with your Primary Care Provider within one week regarding your visit today; please call their office to schedule an appointment even if you are feeling better for a follow-up visit. You may use the muscle relaxer Robaxin as prescribed to help with your pain.  Do not drive or operate machinery will take this medication as it will make you sleepy. You may use the medication prednisone to help with your sciatica.  Only use this as prescribed. You may use the medicated patch Lidoderm as prescribed. You have been given a medicine called Toradol today this is an anti-inflammatory medication similar to ibuprofen and Motrin.  Do not take medicines containing ibuprofen for the next 2 days.  Please drink plenty of water.  Get help right away if: You have weakness or numbness in one or both of your legs or feet. You have trouble controlling your bladder or your bowels. You have nausea or vomiting. You have pain in your abdomen. You have shortness of breath or you faint. You have numbness to your genitals Get help right away if: You cannot control when you pee (urinate) or poop (have a bowel movement). You have weakness in any of these areas and it gets worse. Lower back. Lower belly (pelvis). Butt (buttocks). Legs. You have redness or swelling of your back. You have a burning feeling when you pee. You have change to your normal chronic back pain Any new or concerning symptoms  Please read the additional information packets attached to your discharge summary.  Do not take your medicine if  develop an  itchy rash, swelling in your mouth or lips, or difficulty breathing.

## 2018-08-08 NOTE — ED Triage Notes (Signed)
Pt reports lower back pain since Friday. Pt reports he was lifting heavy objects when the pain started. Pt reports 650 mg of tylenol at 8 am with no relief.

## 2018-08-08 NOTE — ED Provider Notes (Addendum)
[place] EMERGENCY DEPARTMENT Provider Note   CSN: [id number] Arrival date & time: 08/08/18  1659    History   Chief Complaint Chief Complaint  Patient presents with  . Back Pain    HPI [name] is a 48 y.o. male presents today for left lower back pain.  Patient states that he has chronic back pain and has had 2 lumbar spine surgeries greater than 5 years ago.  Patient reports that on Friday night he was working, lifting an object when he had onset of left lower back pain.  He describes it as a moderate intensity constant sharp/ache worsened with movement and palpation and without alleviating factors.  Patient states that his pain today feels exactly like his normal chronic back pain, he denies abnormal feature.  Patient states that the pain occasionally shoots down to his left glute when it is at its most severe.  Patient denies fever, chills, IV drug use, history of cancer, saddle area paresthesias, bowel/bladder incontinence, urinary retention, fall/trauma or any additional concerns today.  Patient states that he plans to follow-up with his surgeon, Dr. Ronnald Ramp, within the next week.     HPI  Past Medical History:  Diagnosis Date  . Arthritis   . BACK PAIN, CHRONIC 06/17/2007  . 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 Depression 04/19/2011  . HEMORRHOIDS, INTERNAL 06/17/2007  . Hyperlipidemia   . Hypertension   . Lumbar disc disease 03/19/2011  . RECTAL BLEEDING 06/17/2007    Patient Active Problem List   Diagnosis Date Noted  . Bronchitis 06/12/2012  . Abdominal pain, epigastric 10/04/2011  . GERD (gastroesophageal reflux disease) 08/11/2011  . Anxiety 07/06/2011  . Depression 04/19/2011  . Insomnia 04/19/2011  . Encounter for long-term (current) use of high-risk medication 04/19/2011  . Lumbar disc disease 03/19/2011  . Fatigue 03/19/2011  . Bladder neck obstruction 03/19/2011  . Smoker 03/19/2011  . Arthritis   . Hyperlipidemia   .  Hypertension   . Preventative health care 03/16/2011  . HEMORRHOIDS, INTERNAL 06/17/2007  . RECTAL BLEEDING 06/17/2007  . BACK PAIN, CHRONIC 06/17/2007  . 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 BACK SURGERY  2006 and 2011   x 2; Dr Hassel Neth  . COLONOSCOPY    . POLYPECTOMY    . TONSILLECTOMY AND ADENOIDECTOMY          Home Medications    Prior to Admission medications   Medication Sig Start Date End Date Taking? Authorizing Provider  acetaminophen (TYLENOL) 325 MG tablet Take 2 tablets (650 mg total) by mouth every 6 (six) hours as needed. Do not take more than 4000mg  of tylenol per day 10/16/17  Yes Couture, Cortni S, PA-C  hydrochlorothiazide (MICROZIDE) 12.5 MG capsule Take 12.5 mg by mouth daily.    [provider]  hydrOXYzine (ATARAX/VISTARIL) 25 MG tablet Take 1 tablet (25 mg total) by mouth every 6 (six) hours. Patient not taking: Reported on 08/08/2018 12/16/16   Ashley Murrain, NP  lidocaine (LIDODERM) 5 % Place 1 patch onto the skin daily. Remove & Discard patch within 12 hours or as directed by MD 08/08/18   Deliah Boston, PA-C  methocarbamol (ROBAXIN) 500 MG tablet Take 1 tablet (500 mg total) by mouth 3 (three) times daily. 08/08/18   Nuala Alpha A, PA-C  predniSONE (DELTASONE) 10 MG tablet Take 4 tablets (40 mg total) by mouth daily for 5 days. 08/08/18 08/13/18  Nuala Alpha  A, PA-C  ranitidine (ZANTAC) 150 MG tablet Take 1 tablet (150 mg total) by mouth 2 (two) times daily. Patient not taking: Reported on 03/17/2017 12/16/16   Ashley Murrain, NP    Family History Family History  Problem Relation Age of Onset  . Arthritis Mother   . Heart disease Mother   . Hypertension Mother   . Diabetes Mother   . Colon cancer Neg Hx   . Esophageal cancer Neg Hx   . Stomach cancer Neg Hx   . Rectal cancer Neg Hx     Social History Social History   Tobacco Use  . Smoking status: Current Every Day  Smoker    Packs/day: 1.00    Years: 29.00    Pack years: 29.00    Types: Cigarettes  . Smokeless tobacco: Former Systems developer  . Tobacco comment: Counseling sheet given in exam room   Substance Use Topics  . Alcohol use: Yes    Comment: social--twelve pack monthly   . Drug use: No     Allergies   Gadolinium derivatives; Contrast media [iodinated diagnostic agents]; Ibuprofen; Lipitor [atorvastatin]; Morphine and related; Norco [hydrocodone-acetaminophen]; Penicillins; Tramadol; and Valium   Review of Systems Review of Systems  Constitutional: Negative.  Negative for chills and fever.  Cardiovascular: Negative.  Negative for chest pain.  Gastrointestinal: Negative.  Negative for abdominal pain, nausea and vomiting.  Genitourinary: Negative.  Negative for dysuria and hematuria.  Musculoskeletal: Positive for back pain. Negative for neck pain.  Neurological: Negative.  Negative for dizziness, syncope, weakness, numbness and headaches.       Denies saddle area paresthesias Denies bowel/bladder incontinence Denies urinary retention   Physical Exam Updated Vital Signs BP 140/90   Pulse 70   Temp 97.8 F (36.6 C) (Oral)   Resp 18   Ht 5\' 9"  (1.753 m)   Wt 86.2 kg   SpO2 100%   BMI 28.06 kg/m   Physical Exam Constitutional:      General: He is not in acute distress.    Appearance: Normal appearance. He is not ill-appearing or diaphoretic.  HENT:     Head: Normocephalic and atraumatic. No raccoon eyes or Battle's sign.     Jaw: There is normal jaw occlusion. No trismus.     Right Ear: Tympanic membrane, ear canal and external ear normal. No hemotympanum.     Left Ear: Tympanic membrane, ear canal and external ear normal. No hemotympanum.     Nose: Nose normal.     Mouth/Throat:     Lips: Pink.     Mouth: Mucous membranes are moist.     Pharynx: Oropharynx is clear. Uvula midline.  Eyes:     General: Vision grossly intact. Gaze aligned appropriately.     Extraocular  Movements: Extraocular movements intact.     Conjunctiva/sclera: Conjunctivae normal.     Pupils: Pupils are equal, round, and reactive to light.  Neck:     Musculoskeletal: Normal range of motion and neck supple. No neck rigidity.     Trachea: Trachea and phonation normal. No tracheal tenderness or tracheal deviation.  Cardiovascular:     Rate and Rhythm: Normal rate and regular rhythm.     Pulses:          Radial pulses are 2+ on the right side and 2+ on the left side.       Dorsalis pedis pulses are 2+ on the right side and 2+ on the left side.  Posterior tibial pulses are 2+ on the right side and 2+ on the left side.     Heart sounds: Normal heart sounds.  Pulmonary:     Effort: Pulmonary effort is normal. No respiratory distress.     Breath sounds: Normal breath sounds and air entry. No decreased breath sounds or rhonchi.  Abdominal:     General: Bowel sounds are normal. There is no distension.     Palpations: Abdomen is soft. There is no pulsatile mass.     Tenderness: There is no abdominal tenderness. There is no right CVA tenderness, left CVA tenderness, guarding or rebound.  Musculoskeletal:       Back:     Comments: No midline C/T/L spinal tenderness to palpation no deformity, crepitus, or step-off noted. No sign of injury to the neck or back.  Well-healed surgical scar of the lumbar spine.  Without signs of infection.  Reproducible tenderness to palpation of the left lumbar muscles.   Feet:     Right foot:     Protective Sensation: 3 sites tested. 3 sites sensed.     Left foot:     Protective Sensation: 3 sites tested. 3 sites sensed.  Skin:    General: Skin is warm and dry.     Capillary Refill: Capillary refill takes less than 2 seconds.  Neurological:     Mental Status: He is alert.     GCS: GCS eye subscore is 4. GCS verbal subscore is 5. GCS motor subscore is 6.     Comments: Mental Status: Alert, oriented, thought content appropriate, able to give a  coherent history. Speech fluent without evidence of aphasia. Able to follow 2 step commands without difficulty.  Major Cranial nerves without deficit, no facial droop  Normal tone. 5/5 strength in upper and lower extremities bilaterally including strong and equal grip strength and dorsiflexion/plantar flexion Sensory: Sensation intact to light touch in all extremities. Deep Tendon Reflexes: 2+ and symmetric in the biceps and patella.  No clonus of the feet. Cerebellar: normal finger-to-nose with bilateral upper extremities.  No pronator drift.  Gait: Slow gait secondary to back pain CV: distal pulses palpable throughout  Psychiatric:        Behavior: Behavior is cooperative.    ED Treatments / Results  Labs (all labs ordered are listed, but only abnormal results are displayed) Labs Reviewed - No data to display  EKG None  Radiology No results found.  Procedures Procedures (including critical care time)  Medications Ordered in ED Medications  lidocaine (LIDODERM) 5 % 1 patch (1 patch Transdermal Patch Applied 08/08/18 1856)  oxyCODONE-acetaminophen (PERCOCET/ROXICET) 5-325 MG per tablet 1 tablet (1 tablet Oral Given 08/08/18 1810)  ketorolac (TORADOL) 15 MG/ML injection 15 mg (15 mg Intramuscular Given 08/08/18 1855)     Initial Impression / Assessment and Plan / ED Course  I have reviewed the triage vital signs and the nursing notes.  Pertinent labs & imaging results that were available during my care of the patient were reviewed by me and considered in my medical decision making (see chart for details).    [name] is a 48 y.o. male presenting with left-sided lower back pain.  Pain has been present for 4 days and is described as his typical back pain. Patient denies history of trauma, fever, IV drug use, night sweats, weight loss, cancer, saddle anesthesia, urinary rentention, bowel/bladder incontinence. No neurological deficits and normal neuro exam.   Suspect  musculoskeletal etiology of patient's pain. Pain  is consistently reproducible with palpation of the left lumbar back musculature.  There is no midline tenderness.  Abdomen soft/nontender and without pulsatile mass. Patient with equal pedal pulses. Doubt spinal epidural abscess, cauda equina or AAA.  I have discussed imaging of the lumbar spine with the patient. He has no history of trauma or change from his normal chronic back pain.  I have offered patient imaging of his lumbar spine today.  Shared decision-making was performed and patient has chosen not to pursue lumbar spine x-ray today.  Patient is ambulatory in the emergency department without assistance. RICE protocol and pain medicine indicated and discussed with patient.   Patient denies allergy to Toradol, 15 mg IM given. Patient denies history of CKD or gastric ulcers/bleeding. Robaxin 500mg  BID prescribed. Patient informed to avoid driving or operating heavy machinery while taking muscle relaxer. Patient given 1 Percocet here for his pain.  He denies allergy to this medication.  Patient's wife is here to drive him home. Discussed prednisone with the patient, as he is having left-sided sciatic symptoms, he states that he is taken this medication in the past with great relief of sciatica, he denies history of diabetes or adverse reaction.  Prednisone prescribed. - Patient observed without signs of allergic reaction.  States improvement of his pain.  He is ambulating around the hallway requesting immediate discharge.  At this time there does not appear to be any evidence of an acute emergency medical condition and the patient appears stable for discharge with appropriate outpatient follow up. Diagnosis was discussed with patient who verbalizes understanding of care plan and is agreeable to discharge. I have discussed return precautions with patient and wife who verbalize understanding of return precautions. Patient strongly encouraged to  follow-up with their PCP and ortho. All questions answered.  Patient has been discharged in good condition.   Note: Portions of this report may have been transcribed using voice recognition software. Every effort was made to ensure accuracy; however, inadvertent computerized transcription errors may still be present. Final Clinical Impressions(s) / ED Diagnoses   Final diagnoses:  Chronic left-sided low back pain with left-sided sciatica    ED Discharge Orders         Ordered    predniSONE (DELTASONE) 10 MG tablet  Daily     08/08/18 1850    methocarbamol (ROBAXIN) 500 MG tablet  3 times daily     08/08/18 1850    lidocaine (LIDODERM) 5 %  Every 24 hours     08/08/18 1850           Gari Crown 08/08/18 2106    Deliah Boston, PA-C 08/08/18 2107    Deliah Boston, PA-C 08/08/18 2108    Deno Etienne, DO 08/09/18 1502

## 2019-06-13 DIAGNOSIS — Z79899 Other long term (current) drug therapy: Secondary | ICD-10-CM | POA: Diagnosis not present

## 2019-06-13 DIAGNOSIS — M542 Cervicalgia: Secondary | ICD-10-CM | POA: Diagnosis not present

## 2019-06-13 DIAGNOSIS — F17210 Nicotine dependence, cigarettes, uncomplicated: Secondary | ICD-10-CM | POA: Diagnosis not present

## 2019-06-13 DIAGNOSIS — M545 Low back pain: Secondary | ICD-10-CM | POA: Diagnosis not present

## 2019-06-13 DIAGNOSIS — M79605 Pain in left leg: Secondary | ICD-10-CM | POA: Diagnosis not present

## 2019-06-13 DIAGNOSIS — R69 Illness, unspecified: Secondary | ICD-10-CM | POA: Diagnosis not present

## 2019-06-13 DIAGNOSIS — E559 Vitamin D deficiency, unspecified: Secondary | ICD-10-CM | POA: Diagnosis not present

## 2019-07-09 IMAGING — DX DG ELBOW COMPLETE 3+V*R*
4 series · 4 of 4 positions shown · non-contrast
Comparison: None.

CLINICAL DATA: Fall, RIGHT arm pain.

EXAM:
RIGHT ELBOW - COMPLETE 3+ VIEW

[elbow ap]
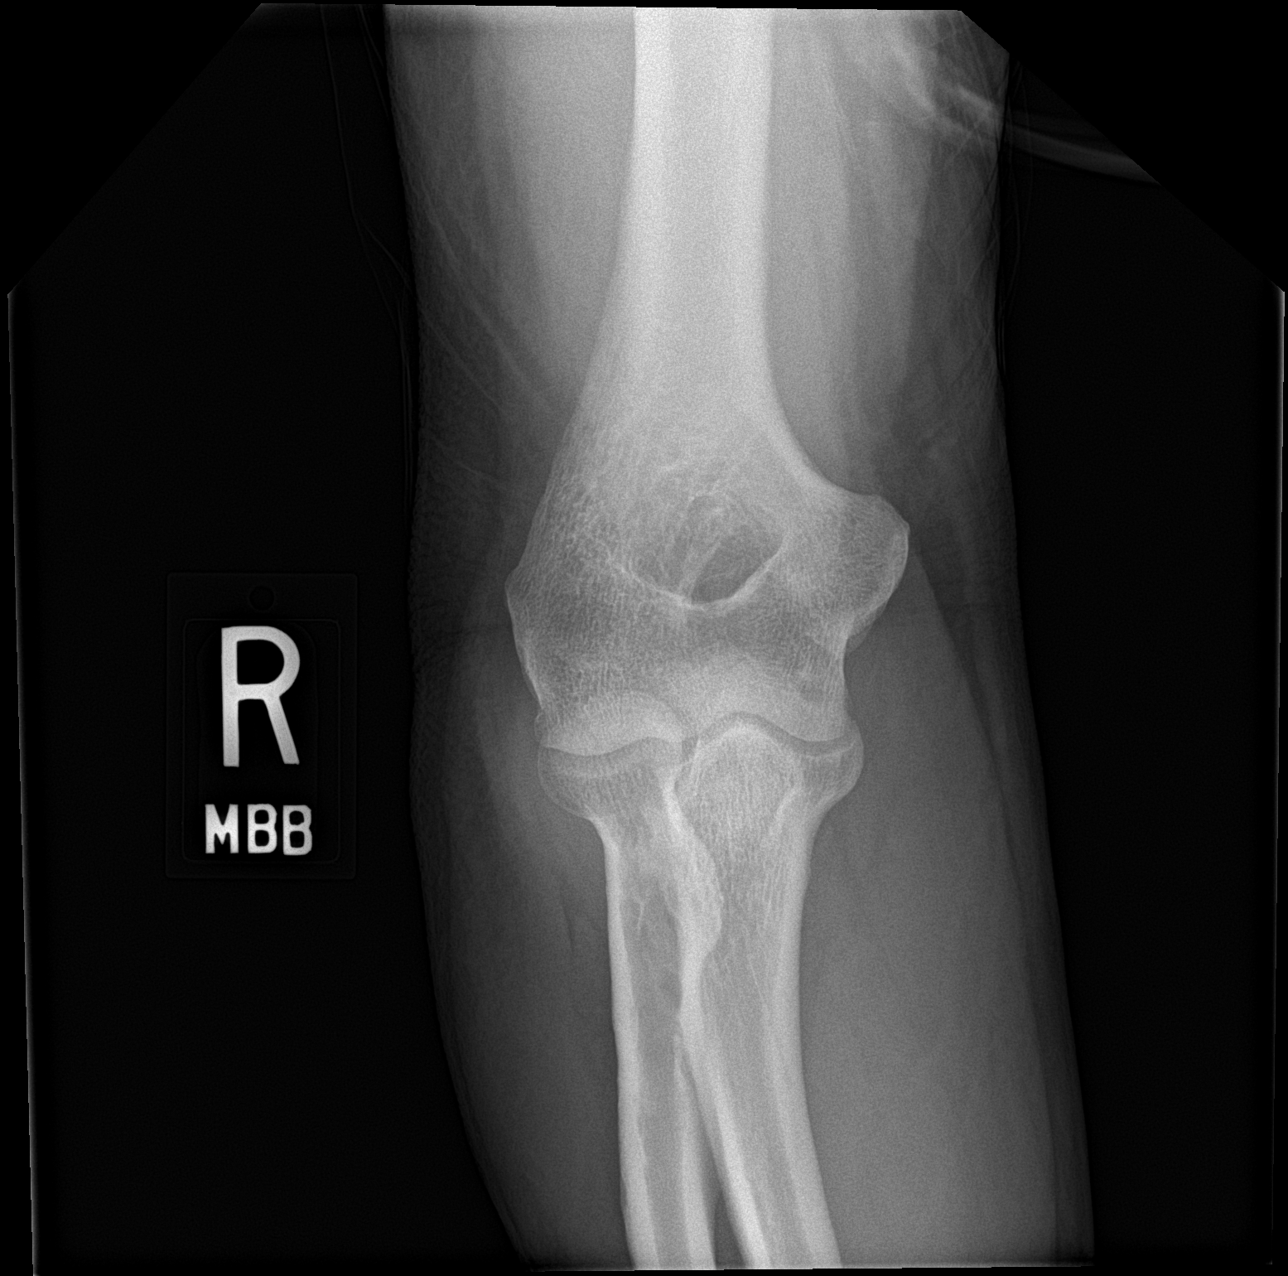

[elbow obl (1 of 2)]
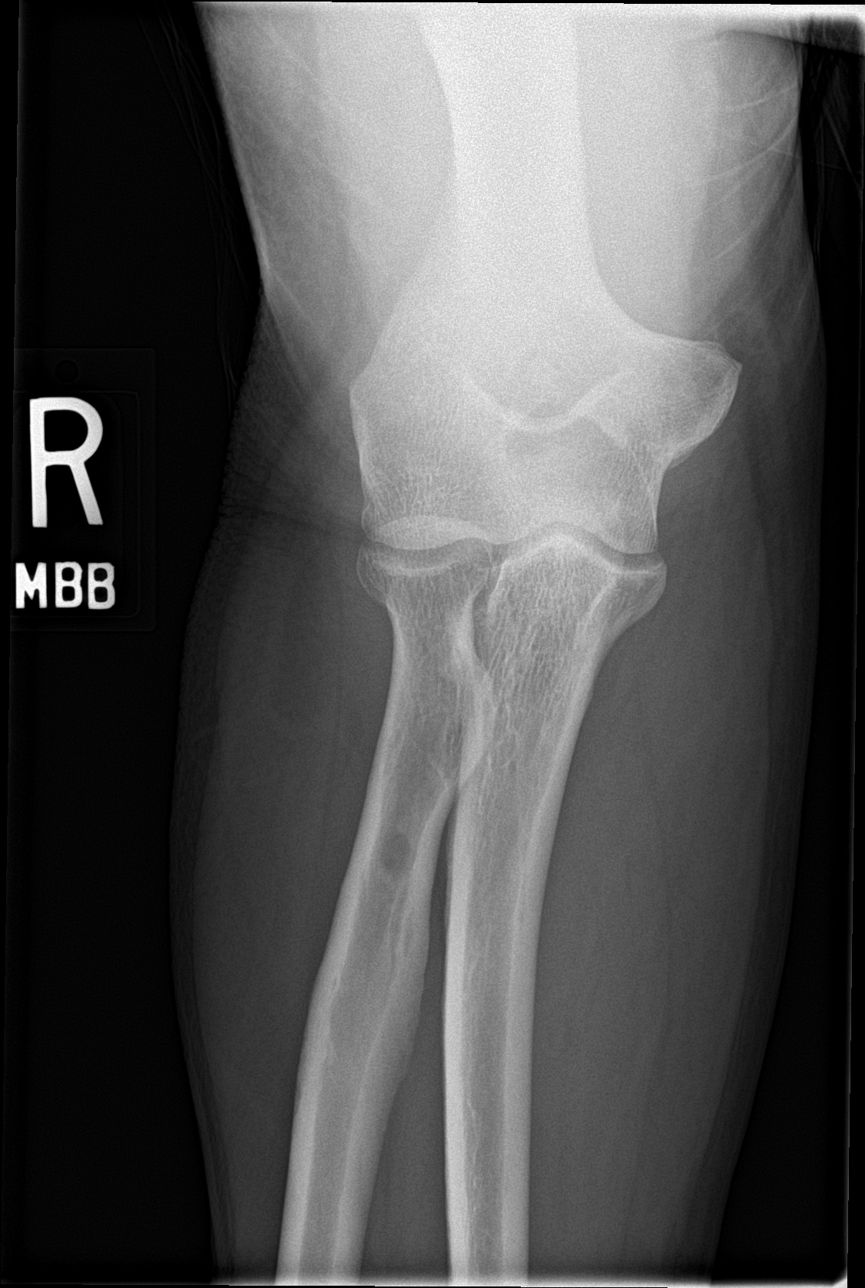

[elbow obl (2 of 2)]
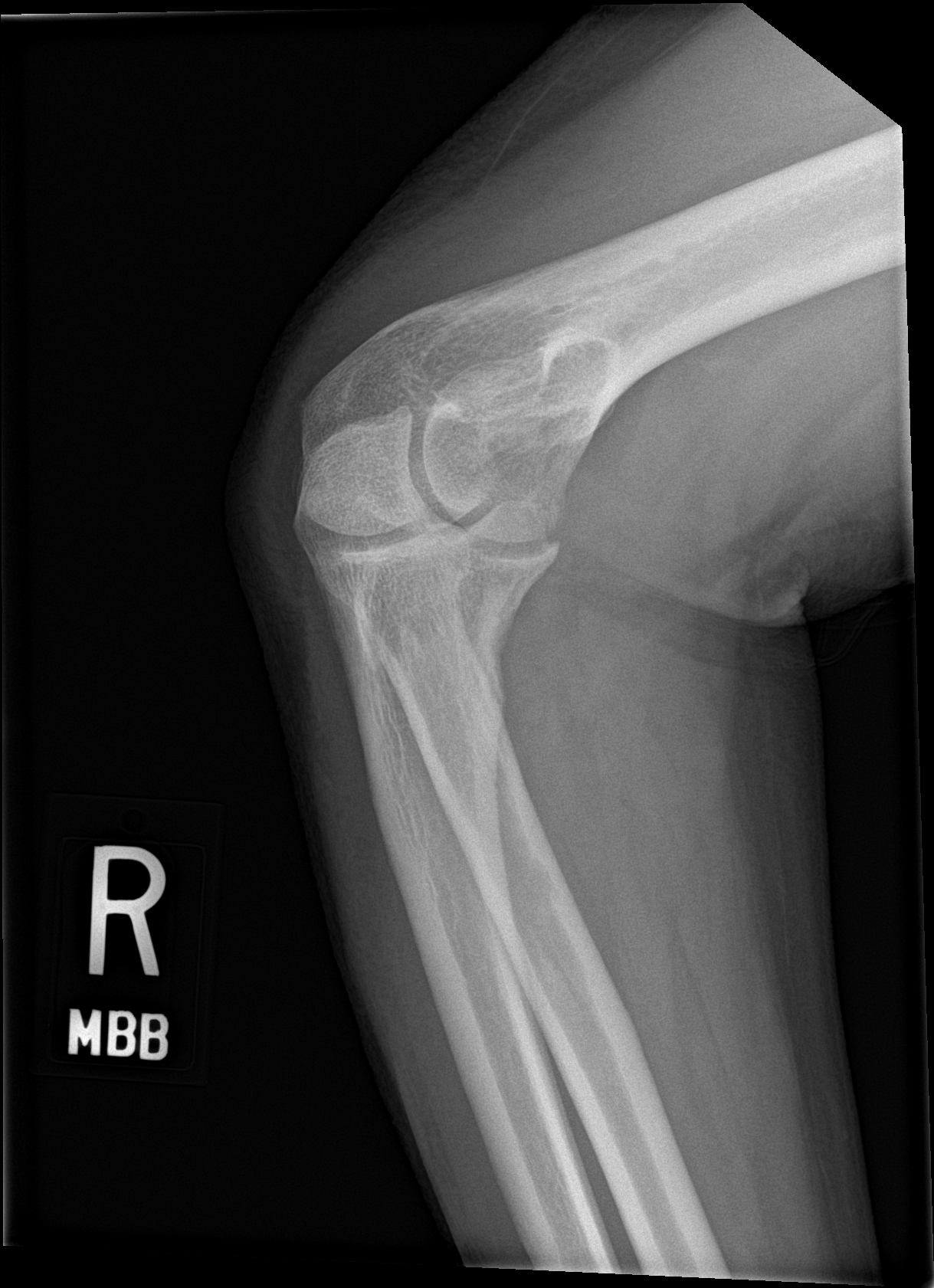

[elbow lat]
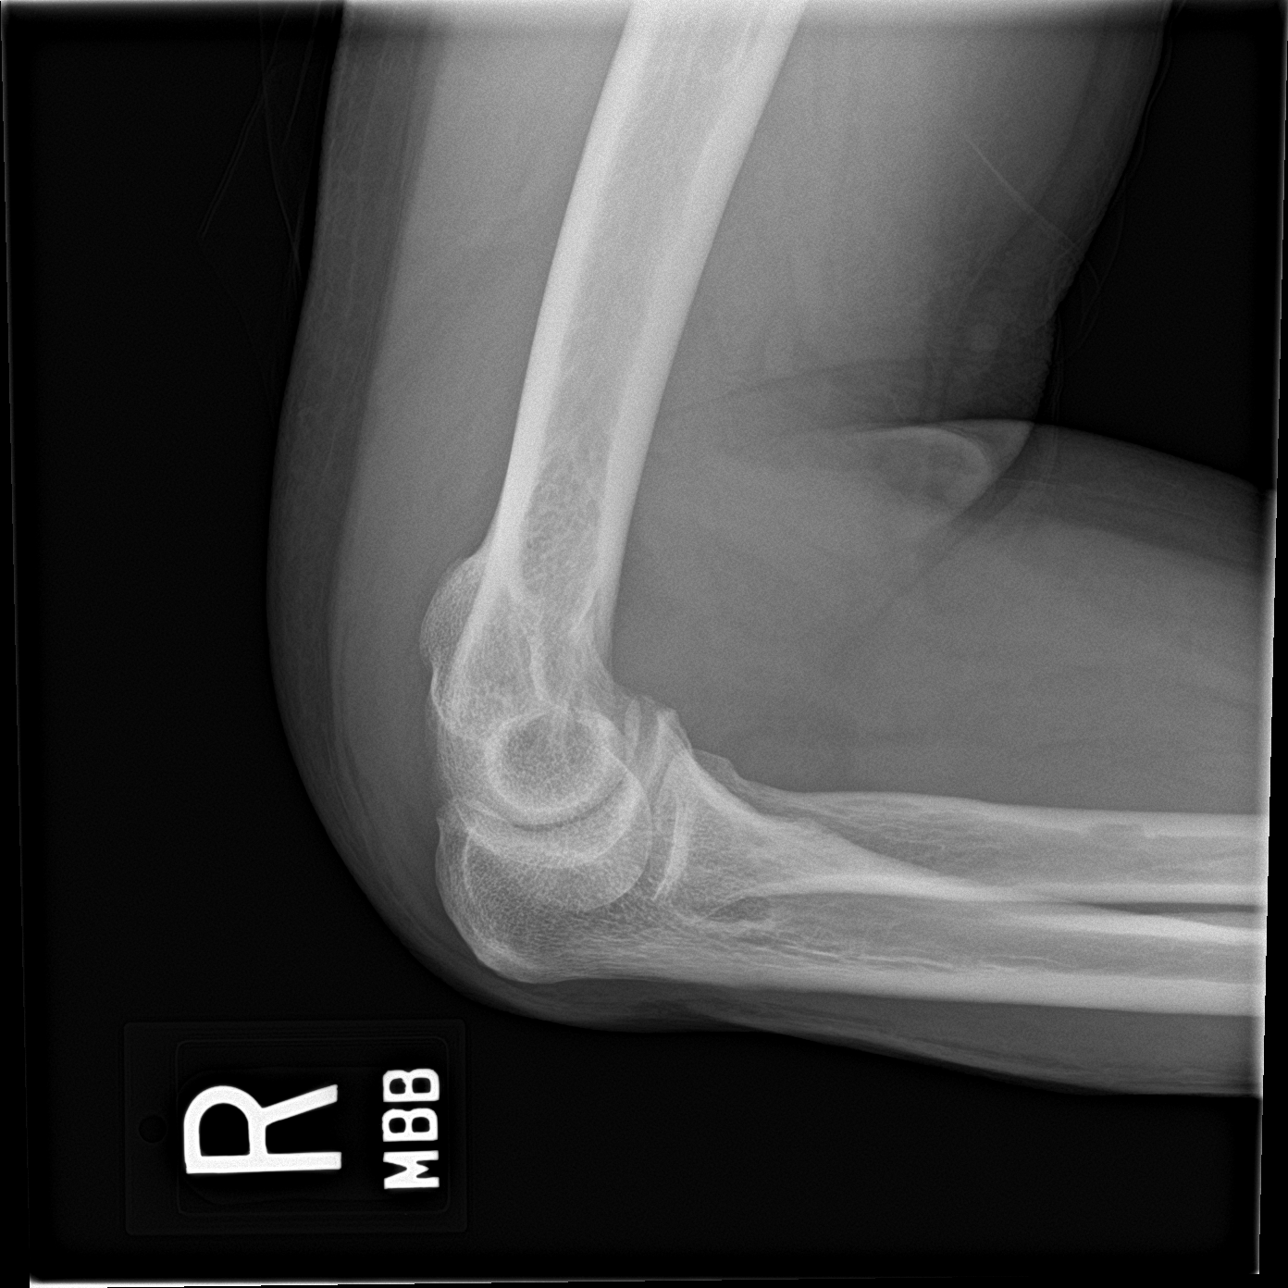

[4 of 4 positions shown; findings below may reference images not displayed]

FINDINGS: On the RIGHT forearm exam, there was suggestion of a nondisplaced
fracture line at the RIGHT radial head. This is not confirmed on the
elbow images. Furthermore, there is no evidence of joint effusion
seen to suggest intra-articular fracture.

Proximal ulna appears intact and normally aligned. Distal humerus
appears intact and normally aligned.
IMPRESSION: 1. The suspected nondisplaced fracture at the RIGHT radial head (as
seen on the RIGHT forearm exam) is not confirmed on these 4 images
of the RIGHT elbow.
2. No fracture or dislocation seen on these images. No evidence of
joint effusion.

## 2019-07-13 DIAGNOSIS — Z79899 Other long term (current) drug therapy: Secondary | ICD-10-CM | POA: Diagnosis not present

## 2019-07-13 DIAGNOSIS — F17210 Nicotine dependence, cigarettes, uncomplicated: Secondary | ICD-10-CM | POA: Diagnosis not present

## 2019-07-13 DIAGNOSIS — R69 Illness, unspecified: Secondary | ICD-10-CM | POA: Diagnosis not present

## 2019-07-13 DIAGNOSIS — M542 Cervicalgia: Secondary | ICD-10-CM | POA: Diagnosis not present

## 2019-07-13 DIAGNOSIS — M545 Low back pain: Secondary | ICD-10-CM | POA: Diagnosis not present

## 2019-07-13 DIAGNOSIS — M79605 Pain in left leg: Secondary | ICD-10-CM | POA: Diagnosis not present

## 2019-07-27 DIAGNOSIS — Z79899 Other long term (current) drug therapy: Secondary | ICD-10-CM | POA: Diagnosis not present

## 2019-07-27 DIAGNOSIS — M545 Low back pain: Secondary | ICD-10-CM | POA: Diagnosis not present

## 2019-07-27 DIAGNOSIS — M542 Cervicalgia: Secondary | ICD-10-CM | POA: Diagnosis not present

## 2019-07-27 DIAGNOSIS — R69 Illness, unspecified: Secondary | ICD-10-CM | POA: Diagnosis not present

## 2019-07-27 DIAGNOSIS — F17210 Nicotine dependence, cigarettes, uncomplicated: Secondary | ICD-10-CM | POA: Diagnosis not present

## 2019-07-27 DIAGNOSIS — M79672 Pain in left foot: Secondary | ICD-10-CM | POA: Diagnosis not present

## 2019-07-27 DIAGNOSIS — M79605 Pain in left leg: Secondary | ICD-10-CM | POA: Diagnosis not present

## 2019-08-24 DIAGNOSIS — F17210 Nicotine dependence, cigarettes, uncomplicated: Secondary | ICD-10-CM | POA: Diagnosis not present

## 2019-08-24 DIAGNOSIS — M542 Cervicalgia: Secondary | ICD-10-CM | POA: Diagnosis not present

## 2019-08-24 DIAGNOSIS — Z79899 Other long term (current) drug therapy: Secondary | ICD-10-CM | POA: Diagnosis not present

## 2019-08-24 DIAGNOSIS — R252 Cramp and spasm: Secondary | ICD-10-CM | POA: Diagnosis not present

## 2019-08-24 DIAGNOSIS — M79605 Pain in left leg: Secondary | ICD-10-CM | POA: Diagnosis not present

## 2019-08-24 DIAGNOSIS — R69 Illness, unspecified: Secondary | ICD-10-CM | POA: Diagnosis not present

## 2019-08-24 DIAGNOSIS — M545 Low back pain: Secondary | ICD-10-CM | POA: Diagnosis not present

## 2019-09-21 DIAGNOSIS — Z79899 Other long term (current) drug therapy: Secondary | ICD-10-CM | POA: Diagnosis not present

## 2019-09-21 DIAGNOSIS — R69 Illness, unspecified: Secondary | ICD-10-CM | POA: Diagnosis not present

## 2019-09-21 DIAGNOSIS — M545 Low back pain: Secondary | ICD-10-CM | POA: Diagnosis not present

## 2019-09-21 DIAGNOSIS — M79605 Pain in left leg: Secondary | ICD-10-CM | POA: Diagnosis not present

## 2019-09-21 DIAGNOSIS — M549 Dorsalgia, unspecified: Secondary | ICD-10-CM | POA: Diagnosis not present

## 2019-09-21 DIAGNOSIS — F17210 Nicotine dependence, cigarettes, uncomplicated: Secondary | ICD-10-CM | POA: Diagnosis not present

## 2019-10-22 DIAGNOSIS — R252 Cramp and spasm: Secondary | ICD-10-CM | POA: Diagnosis not present

## 2019-10-22 DIAGNOSIS — E559 Vitamin D deficiency, unspecified: Secondary | ICD-10-CM | POA: Diagnosis not present

## 2019-10-22 DIAGNOSIS — M542 Cervicalgia: Secondary | ICD-10-CM | POA: Diagnosis not present

## 2019-10-22 DIAGNOSIS — M129 Arthropathy, unspecified: Secondary | ICD-10-CM | POA: Diagnosis not present

## 2019-10-22 DIAGNOSIS — Z79899 Other long term (current) drug therapy: Secondary | ICD-10-CM | POA: Diagnosis not present

## 2019-10-22 DIAGNOSIS — M545 Low back pain: Secondary | ICD-10-CM | POA: Diagnosis not present

## 2019-10-22 DIAGNOSIS — R69 Illness, unspecified: Secondary | ICD-10-CM | POA: Diagnosis not present

## 2019-10-22 DIAGNOSIS — Z1159 Encounter for screening for other viral diseases: Secondary | ICD-10-CM | POA: Diagnosis not present

## 2019-10-22 DIAGNOSIS — F17210 Nicotine dependence, cigarettes, uncomplicated: Secondary | ICD-10-CM | POA: Diagnosis not present

## 2019-10-22 DIAGNOSIS — M79605 Pain in left leg: Secondary | ICD-10-CM | POA: Diagnosis not present

## 2019-11-22 DIAGNOSIS — Z79899 Other long term (current) drug therapy: Secondary | ICD-10-CM | POA: Diagnosis not present

## 2019-11-22 DIAGNOSIS — F17210 Nicotine dependence, cigarettes, uncomplicated: Secondary | ICD-10-CM | POA: Diagnosis not present

## 2019-11-22 DIAGNOSIS — J3089 Other allergic rhinitis: Secondary | ICD-10-CM | POA: Diagnosis not present

## 2019-11-22 DIAGNOSIS — R69 Illness, unspecified: Secondary | ICD-10-CM | POA: Diagnosis not present

## 2019-11-22 DIAGNOSIS — M79605 Pain in left leg: Secondary | ICD-10-CM | POA: Diagnosis not present

## 2019-11-22 DIAGNOSIS — M545 Low back pain: Secondary | ICD-10-CM | POA: Diagnosis not present

## 2019-12-21 DIAGNOSIS — M79605 Pain in left leg: Secondary | ICD-10-CM | POA: Diagnosis not present

## 2019-12-21 DIAGNOSIS — M542 Cervicalgia: Secondary | ICD-10-CM | POA: Diagnosis not present

## 2019-12-21 DIAGNOSIS — R69 Illness, unspecified: Secondary | ICD-10-CM | POA: Diagnosis not present

## 2019-12-21 DIAGNOSIS — R252 Cramp and spasm: Secondary | ICD-10-CM | POA: Diagnosis not present

## 2019-12-21 DIAGNOSIS — F17210 Nicotine dependence, cigarettes, uncomplicated: Secondary | ICD-10-CM | POA: Diagnosis not present

## 2019-12-21 DIAGNOSIS — J3089 Other allergic rhinitis: Secondary | ICD-10-CM | POA: Diagnosis not present

## 2019-12-21 DIAGNOSIS — M545 Low back pain: Secondary | ICD-10-CM | POA: Diagnosis not present

## 2019-12-21 DIAGNOSIS — Z79899 Other long term (current) drug therapy: Secondary | ICD-10-CM | POA: Diagnosis not present

## 2020-01-14 ENCOUNTER — Ambulatory Visit (INDEPENDENT_AMBULATORY_CARE_PROVIDER_SITE_OTHER): Payer: Medicare HMO | Admitting: Podiatry

## 2020-01-14 ENCOUNTER — Other Ambulatory Visit: Payer: Self-pay | Admitting: Podiatry

## 2020-01-14 ENCOUNTER — Other Ambulatory Visit: Payer: Self-pay

## 2020-01-14 DIAGNOSIS — M21962 Unspecified acquired deformity of left lower leg: Secondary | ICD-10-CM | POA: Diagnosis not present

## 2020-01-14 DIAGNOSIS — L852 Keratosis punctata (palmaris et plantaris): Secondary | ICD-10-CM

## 2020-01-14 DIAGNOSIS — M79672 Pain in left foot: Secondary | ICD-10-CM

## 2020-01-14 NOTE — Progress Notes (Signed)
  Subjective:  Patient ID: [name], male    DOB: 1970-08-16,  MRN: [id number]  Chief Complaint  Patient presents with  . foot lesion    foot lesions at Lt 5th sub met and LT hallux medial side x couple years -hard skin that I try to get out and comes back tx: none     49 y.o. male presents with the above complaint. History confirmed with patient.   Objective:  Physical Exam: warm, good capillary refill, no trophic changes or ulcerative lesions, normal DP and PT pulses and normal sensory exam. Left Foot: POP left 4th met with punctate keratosis, lesion medial hallux IPJ  Assessment:   1. Metatarsal deformity, left   2. Punctate keratosis      Plan:  Patient was evaluated and treated and all questions answered.  Punctate Keratosis  -Discussed possible met osteotomy to prevent lesion recurrence in future. -Pared lesion 4th met, declined paring of hallux lesion. Salinocaine applied to soften -F/u PRN  Should recur will need XR to eval for possible surgical discusison.  Return if symptoms worsen or fail to improve.

## 2020-01-21 DIAGNOSIS — M545 Low back pain: Secondary | ICD-10-CM | POA: Diagnosis not present

## 2020-01-21 DIAGNOSIS — R252 Cramp and spasm: Secondary | ICD-10-CM | POA: Diagnosis not present

## 2020-01-21 DIAGNOSIS — F17210 Nicotine dependence, cigarettes, uncomplicated: Secondary | ICD-10-CM | POA: Diagnosis not present

## 2020-01-21 DIAGNOSIS — R69 Illness, unspecified: Secondary | ICD-10-CM | POA: Diagnosis not present

## 2020-01-21 DIAGNOSIS — M542 Cervicalgia: Secondary | ICD-10-CM | POA: Diagnosis not present

## 2020-01-21 DIAGNOSIS — Z79899 Other long term (current) drug therapy: Secondary | ICD-10-CM | POA: Diagnosis not present

## 2020-02-04 DIAGNOSIS — R69 Illness, unspecified: Secondary | ICD-10-CM | POA: Diagnosis not present

## 2020-02-04 DIAGNOSIS — M79605 Pain in left leg: Secondary | ICD-10-CM | POA: Diagnosis not present

## 2020-02-04 DIAGNOSIS — F17210 Nicotine dependence, cigarettes, uncomplicated: Secondary | ICD-10-CM | POA: Diagnosis not present

## 2020-02-04 DIAGNOSIS — M542 Cervicalgia: Secondary | ICD-10-CM | POA: Diagnosis not present

## 2020-02-04 DIAGNOSIS — Z79899 Other long term (current) drug therapy: Secondary | ICD-10-CM | POA: Diagnosis not present

## 2020-02-04 DIAGNOSIS — R252 Cramp and spasm: Secondary | ICD-10-CM | POA: Diagnosis not present

## 2020-02-04 DIAGNOSIS — M545 Low back pain: Secondary | ICD-10-CM | POA: Diagnosis not present

## 2020-02-18 DIAGNOSIS — G894 Chronic pain syndrome: Secondary | ICD-10-CM | POA: Diagnosis not present

## 2020-02-18 DIAGNOSIS — M545 Low back pain: Secondary | ICD-10-CM | POA: Diagnosis not present

## 2020-02-18 DIAGNOSIS — M79605 Pain in left leg: Secondary | ICD-10-CM | POA: Diagnosis not present

## 2020-02-18 DIAGNOSIS — R69 Illness, unspecified: Secondary | ICD-10-CM | POA: Diagnosis not present

## 2020-02-18 DIAGNOSIS — F17210 Nicotine dependence, cigarettes, uncomplicated: Secondary | ICD-10-CM | POA: Diagnosis not present

## 2020-02-18 DIAGNOSIS — Z79899 Other long term (current) drug therapy: Secondary | ICD-10-CM | POA: Diagnosis not present

## 2020-02-18 DIAGNOSIS — M542 Cervicalgia: Secondary | ICD-10-CM | POA: Diagnosis not present

## 2020-03-18 DIAGNOSIS — E559 Vitamin D deficiency, unspecified: Secondary | ICD-10-CM | POA: Diagnosis not present

## 2020-03-18 DIAGNOSIS — M545 Low back pain, unspecified: Secondary | ICD-10-CM | POA: Diagnosis not present

## 2020-03-18 DIAGNOSIS — M542 Cervicalgia: Secondary | ICD-10-CM | POA: Diagnosis not present

## 2020-03-18 DIAGNOSIS — G894 Chronic pain syndrome: Secondary | ICD-10-CM | POA: Diagnosis not present

## 2020-03-18 DIAGNOSIS — R69 Illness, unspecified: Secondary | ICD-10-CM | POA: Diagnosis not present

## 2020-03-18 DIAGNOSIS — Z79899 Other long term (current) drug therapy: Secondary | ICD-10-CM | POA: Diagnosis not present

## 2020-03-18 DIAGNOSIS — R5383 Other fatigue: Secondary | ICD-10-CM | POA: Diagnosis not present

## 2020-03-18 DIAGNOSIS — Z7189 Other specified counseling: Secondary | ICD-10-CM | POA: Diagnosis not present

## 2020-03-18 DIAGNOSIS — F17210 Nicotine dependence, cigarettes, uncomplicated: Secondary | ICD-10-CM | POA: Diagnosis not present

## 2020-03-18 DIAGNOSIS — Z1159 Encounter for screening for other viral diseases: Secondary | ICD-10-CM | POA: Diagnosis not present

## 2020-03-18 DIAGNOSIS — M79605 Pain in left leg: Secondary | ICD-10-CM | POA: Diagnosis not present

## 2020-03-18 DIAGNOSIS — M129 Arthropathy, unspecified: Secondary | ICD-10-CM | POA: Diagnosis not present

## 2020-04-17 DIAGNOSIS — M545 Low back pain, unspecified: Secondary | ICD-10-CM | POA: Diagnosis not present

## 2020-04-17 DIAGNOSIS — M542 Cervicalgia: Secondary | ICD-10-CM | POA: Diagnosis not present

## 2020-04-17 DIAGNOSIS — R69 Illness, unspecified: Secondary | ICD-10-CM | POA: Diagnosis not present

## 2020-04-17 DIAGNOSIS — F17210 Nicotine dependence, cigarettes, uncomplicated: Secondary | ICD-10-CM | POA: Diagnosis not present

## 2020-04-17 DIAGNOSIS — M79605 Pain in left leg: Secondary | ICD-10-CM | POA: Diagnosis not present

## 2020-04-17 DIAGNOSIS — Z79899 Other long term (current) drug therapy: Secondary | ICD-10-CM | POA: Diagnosis not present

## 2020-04-17 DIAGNOSIS — G894 Chronic pain syndrome: Secondary | ICD-10-CM | POA: Diagnosis not present

## 2020-05-15 DIAGNOSIS — M542 Cervicalgia: Secondary | ICD-10-CM | POA: Diagnosis not present

## 2020-05-15 DIAGNOSIS — R69 Illness, unspecified: Secondary | ICD-10-CM | POA: Diagnosis not present

## 2020-05-15 DIAGNOSIS — M545 Low back pain, unspecified: Secondary | ICD-10-CM | POA: Diagnosis not present

## 2020-05-15 DIAGNOSIS — Z87891 Personal history of nicotine dependence: Secondary | ICD-10-CM | POA: Diagnosis not present

## 2020-05-15 DIAGNOSIS — Z79899 Other long term (current) drug therapy: Secondary | ICD-10-CM | POA: Diagnosis not present

## 2020-05-15 DIAGNOSIS — G894 Chronic pain syndrome: Secondary | ICD-10-CM | POA: Diagnosis not present

## 2020-05-15 DIAGNOSIS — F17210 Nicotine dependence, cigarettes, uncomplicated: Secondary | ICD-10-CM | POA: Diagnosis not present

## 2020-05-15 DIAGNOSIS — M79605 Pain in left leg: Secondary | ICD-10-CM | POA: Diagnosis not present

## 2020-05-26 ENCOUNTER — Other Ambulatory Visit: Payer: Self-pay

## 2020-05-26 ENCOUNTER — Ambulatory Visit (INDEPENDENT_AMBULATORY_CARE_PROVIDER_SITE_OTHER): Payer: Medicare HMO

## 2020-05-26 ENCOUNTER — Encounter: Payer: Self-pay | Admitting: Podiatry

## 2020-05-26 ENCOUNTER — Ambulatory Visit (INDEPENDENT_AMBULATORY_CARE_PROVIDER_SITE_OTHER): Payer: Medicare HMO | Admitting: Podiatry

## 2020-05-26 ENCOUNTER — Other Ambulatory Visit: Payer: Self-pay | Admitting: *Deleted

## 2020-05-26 DIAGNOSIS — M21962 Unspecified acquired deformity of left lower leg: Secondary | ICD-10-CM | POA: Diagnosis not present

## 2020-05-26 DIAGNOSIS — M2042 Other hammer toe(s) (acquired), left foot: Secondary | ICD-10-CM

## 2020-05-26 DIAGNOSIS — L989 Disorder of the skin and subcutaneous tissue, unspecified: Secondary | ICD-10-CM | POA: Diagnosis not present

## 2020-05-26 DIAGNOSIS — Z720 Tobacco use: Secondary | ICD-10-CM

## 2020-05-26 NOTE — Progress Notes (Signed)
  Subjective:  Patient ID: [name], male    DOB: 1970/06/30,  MRN: [id number]  Chief Complaint  Patient presents with  . Callouses    I have a callus on the ball of the left foot and hurts like the dickens    49 y.o. male presents with the above complaint. History confirmed with patient.   Objective:  Physical Exam: warm, good capillary refill, no trophic changes or ulcerative lesions, normal DP and PT pulses and normal sensory exam. Left Foot: POP left 4th met with punctate keratosis, lesion medial hallux IPJ   Radiographs: XR left foot arthritic changes 1st MPJ, skin marker noted directly under 4th met head. Hammertoes noted Assessment:   1. Metatarsal deformity, left   2. Hammer toe of left foot   3. Benign skin lesion   4. Tobacco use      Plan:  Patient was evaluated and treated and all questions answered.  Punctate Keratosis, met deformity. -XR reviewed with patient -Patient has failed all conservative therapy and wishes to proceed with surgical intervention. All risks, benefits, and alternatives discussed with patient. No guarantees given. Consent reviewed and signed by patient. -Planned procedures: Left 4th metatarsal osteotomy, correction of hammertoe with pin fixation, excision of skin lesion -Identified risk factors: Tobacco use -DME dispensed for post-op use: none   No follow-ups on file.

## 2020-06-18 DIAGNOSIS — M79605 Pain in left leg: Secondary | ICD-10-CM | POA: Diagnosis not present

## 2020-06-18 DIAGNOSIS — G894 Chronic pain syndrome: Secondary | ICD-10-CM | POA: Diagnosis not present

## 2020-06-18 DIAGNOSIS — R69 Illness, unspecified: Secondary | ICD-10-CM | POA: Diagnosis not present

## 2020-06-18 DIAGNOSIS — M545 Low back pain, unspecified: Secondary | ICD-10-CM | POA: Diagnosis not present

## 2020-06-18 DIAGNOSIS — Z79899 Other long term (current) drug therapy: Secondary | ICD-10-CM | POA: Diagnosis not present

## 2020-06-18 DIAGNOSIS — F17210 Nicotine dependence, cigarettes, uncomplicated: Secondary | ICD-10-CM | POA: Diagnosis not present

## 2020-06-18 DIAGNOSIS — M542 Cervicalgia: Secondary | ICD-10-CM | POA: Diagnosis not present

## 2020-07-02 DIAGNOSIS — G894 Chronic pain syndrome: Secondary | ICD-10-CM | POA: Diagnosis not present

## 2020-07-02 DIAGNOSIS — F17210 Nicotine dependence, cigarettes, uncomplicated: Secondary | ICD-10-CM | POA: Diagnosis not present

## 2020-07-02 DIAGNOSIS — M542 Cervicalgia: Secondary | ICD-10-CM | POA: Diagnosis not present

## 2020-07-02 DIAGNOSIS — Z79899 Other long term (current) drug therapy: Secondary | ICD-10-CM | POA: Diagnosis not present

## 2020-07-02 DIAGNOSIS — M79605 Pain in left leg: Secondary | ICD-10-CM | POA: Diagnosis not present

## 2020-07-02 DIAGNOSIS — R69 Illness, unspecified: Secondary | ICD-10-CM | POA: Diagnosis not present

## 2020-07-02 DIAGNOSIS — M545 Low back pain, unspecified: Secondary | ICD-10-CM | POA: Diagnosis not present

## 2020-07-09 ENCOUNTER — Telehealth: Payer: Self-pay

## 2020-07-09 NOTE — Telephone Encounter (Signed)
DOS 07/16/2020  METATARSAL OSTEOTOMY 4TH LT - 28308 HAMMERTOE REPAIR 4TH LT - 28285 EXC BENIGN LESION LT - 11421  [employer] MEDICARE EFFECTIVE DATE - 06/01/2019  PLAN DEDUCTIBLE - $0.00 OUT OF POCKET - $0.00 COPAY $250.00 COINSURANCE - 100%  BENEFITS PER PHONE CALL TO [employer] CALL REF#[id number]  PER [employer] WEBSITE NO PRECERT REQUIRED FOR CPT 28308, [id number] OR 93818

## 2020-07-14 ENCOUNTER — Encounter: Payer: Medicare HMO | Admitting: Podiatry

## 2020-07-16 ENCOUNTER — Other Ambulatory Visit: Payer: Self-pay | Admitting: Podiatry

## 2020-07-16 MED ORDER — OXYCODONE-ACETAMINOPHEN 10-325 MG PO TABS: 1.0000 | ORAL_TABLET | ORAL | 0 refills | Status: DC | PRN

## 2020-07-16 MED ORDER — CLINDAMYCIN HCL 150 MG PO CAPS: 150.0000 mg | ORAL_CAPSULE | Freq: Two times a day (BID) | ORAL | 0 refills | Status: AC

## 2020-07-16 MED ORDER — ONDANSETRON HCL 4 MG PO TABS: 4.0000 mg | ORAL_TABLET | Freq: Three times a day (TID) | ORAL | 0 refills | Status: AC | PRN

## 2020-07-18 ENCOUNTER — Telehealth: Payer: Self-pay | Admitting: *Deleted

## 2020-07-18 NOTE — Telephone Encounter (Signed)
Called and spoke with Ronalee Belts at [employer] [phone]) and relayed the message per Dr March Rummage. Lattie Haw

## 2020-07-18 NOTE — Telephone Encounter (Signed)
Pharmacy called and stated that the patient gets pain medicine from pain management and just got 120 tablets from pain management and will not fill the pain medicine that Dr March Rummage sent in and I sent a message to Dr March Rummage just letting him know. [name]

## 2020-07-18 NOTE — Telephone Encounter (Signed)
-----   Message from Evelina Bucy, DPM sent at 07/18/2020 10:06 AM EST ----- Actually can you call his pharmacy and say he did not have surgery and he should not need those meds at all ----- Message ----- From: Viviana Simpler, [facility] Sent: 07/17/2020   5:07 PM EST To: Evelina Bucy, DPM  Hey Pharmacist called yesterday and was not going to fill the pain medicine due to the patient is in pain management and got 120 tablets from the pain doctor. FYI-[place]

## 2020-07-21 ENCOUNTER — Encounter: Payer: Medicare HMO | Admitting: Podiatry

## 2020-07-30 DIAGNOSIS — R69 Illness, unspecified: Secondary | ICD-10-CM | POA: Diagnosis not present

## 2020-07-30 DIAGNOSIS — M79605 Pain in left leg: Secondary | ICD-10-CM | POA: Diagnosis not present

## 2020-07-30 DIAGNOSIS — Z87891 Personal history of nicotine dependence: Secondary | ICD-10-CM | POA: Diagnosis not present

## 2020-07-30 DIAGNOSIS — M545 Low back pain, unspecified: Secondary | ICD-10-CM | POA: Diagnosis not present

## 2020-07-30 DIAGNOSIS — Z79899 Other long term (current) drug therapy: Secondary | ICD-10-CM | POA: Diagnosis not present

## 2020-07-30 DIAGNOSIS — F17210 Nicotine dependence, cigarettes, uncomplicated: Secondary | ICD-10-CM | POA: Diagnosis not present

## 2020-07-30 DIAGNOSIS — G894 Chronic pain syndrome: Secondary | ICD-10-CM | POA: Diagnosis not present

## 2020-07-30 DIAGNOSIS — M542 Cervicalgia: Secondary | ICD-10-CM | POA: Diagnosis not present

## 2020-07-31 ENCOUNTER — Encounter: Payer: Medicare HMO | Admitting: Podiatry

## 2020-08-07 ENCOUNTER — Encounter: Payer: Medicare HMO | Admitting: Podiatry

## 2020-08-11 ENCOUNTER — Encounter: Payer: Medicare HMO | Admitting: Podiatry

## 2020-08-30 DIAGNOSIS — Z79899 Other long term (current) drug therapy: Secondary | ICD-10-CM | POA: Diagnosis not present

## 2020-08-30 DIAGNOSIS — R69 Illness, unspecified: Secondary | ICD-10-CM | POA: Diagnosis not present

## 2020-08-30 DIAGNOSIS — M545 Low back pain, unspecified: Secondary | ICD-10-CM | POA: Diagnosis not present

## 2020-08-30 DIAGNOSIS — M79605 Pain in left leg: Secondary | ICD-10-CM | POA: Diagnosis not present

## 2020-08-30 DIAGNOSIS — F17210 Nicotine dependence, cigarettes, uncomplicated: Secondary | ICD-10-CM | POA: Diagnosis not present

## 2020-08-30 DIAGNOSIS — M542 Cervicalgia: Secondary | ICD-10-CM | POA: Diagnosis not present

## 2020-08-30 DIAGNOSIS — G894 Chronic pain syndrome: Secondary | ICD-10-CM | POA: Diagnosis not present

## 2020-09-27 DIAGNOSIS — F17210 Nicotine dependence, cigarettes, uncomplicated: Secondary | ICD-10-CM | POA: Diagnosis not present

## 2020-09-27 DIAGNOSIS — M129 Arthropathy, unspecified: Secondary | ICD-10-CM | POA: Diagnosis not present

## 2020-09-27 DIAGNOSIS — Z1159 Encounter for screening for other viral diseases: Secondary | ICD-10-CM | POA: Diagnosis not present

## 2020-09-27 DIAGNOSIS — M545 Low back pain, unspecified: Secondary | ICD-10-CM | POA: Diagnosis not present

## 2020-09-27 DIAGNOSIS — G894 Chronic pain syndrome: Secondary | ICD-10-CM | POA: Diagnosis not present

## 2020-09-27 DIAGNOSIS — M542 Cervicalgia: Secondary | ICD-10-CM | POA: Diagnosis not present

## 2020-09-27 DIAGNOSIS — R69 Illness, unspecified: Secondary | ICD-10-CM | POA: Diagnosis not present

## 2020-09-27 DIAGNOSIS — Z79899 Other long term (current) drug therapy: Secondary | ICD-10-CM | POA: Diagnosis not present

## 2020-09-27 DIAGNOSIS — M79605 Pain in left leg: Secondary | ICD-10-CM | POA: Diagnosis not present

## 2020-09-27 DIAGNOSIS — E559 Vitamin D deficiency, unspecified: Secondary | ICD-10-CM | POA: Diagnosis not present

## 2020-10-25 DIAGNOSIS — M79605 Pain in left leg: Secondary | ICD-10-CM | POA: Diagnosis not present

## 2020-10-25 DIAGNOSIS — M545 Low back pain, unspecified: Secondary | ICD-10-CM | POA: Diagnosis not present

## 2020-10-25 DIAGNOSIS — M542 Cervicalgia: Secondary | ICD-10-CM | POA: Diagnosis not present

## 2020-10-25 DIAGNOSIS — G894 Chronic pain syndrome: Secondary | ICD-10-CM | POA: Diagnosis not present

## 2020-10-25 DIAGNOSIS — F17210 Nicotine dependence, cigarettes, uncomplicated: Secondary | ICD-10-CM | POA: Diagnosis not present

## 2020-10-25 DIAGNOSIS — Z79899 Other long term (current) drug therapy: Secondary | ICD-10-CM | POA: Diagnosis not present

## 2020-10-25 DIAGNOSIS — R69 Illness, unspecified: Secondary | ICD-10-CM | POA: Diagnosis not present

## 2020-11-25 DIAGNOSIS — M542 Cervicalgia: Secondary | ICD-10-CM | POA: Diagnosis not present

## 2020-11-25 DIAGNOSIS — G894 Chronic pain syndrome: Secondary | ICD-10-CM | POA: Diagnosis not present

## 2020-11-25 DIAGNOSIS — R69 Illness, unspecified: Secondary | ICD-10-CM | POA: Diagnosis not present

## 2020-11-25 DIAGNOSIS — F17210 Nicotine dependence, cigarettes, uncomplicated: Secondary | ICD-10-CM | POA: Diagnosis not present

## 2020-11-25 DIAGNOSIS — Z79899 Other long term (current) drug therapy: Secondary | ICD-10-CM | POA: Diagnosis not present

## 2020-11-25 DIAGNOSIS — M79605 Pain in left leg: Secondary | ICD-10-CM | POA: Diagnosis not present

## 2020-11-25 DIAGNOSIS — M545 Low back pain, unspecified: Secondary | ICD-10-CM | POA: Diagnosis not present

## 2020-12-15 ENCOUNTER — Ambulatory Visit (INDEPENDENT_AMBULATORY_CARE_PROVIDER_SITE_OTHER): Payer: Medicare HMO | Admitting: Podiatry

## 2020-12-15 DIAGNOSIS — M21962 Unspecified acquired deformity of left lower leg: Secondary | ICD-10-CM | POA: Diagnosis not present

## 2020-12-15 DIAGNOSIS — M2042 Other hammer toe(s) (acquired), left foot: Secondary | ICD-10-CM | POA: Diagnosis not present

## 2020-12-15 DIAGNOSIS — L989 Disorder of the skin and subcutaneous tissue, unspecified: Secondary | ICD-10-CM

## 2020-12-16 ENCOUNTER — Telehealth: Payer: Self-pay

## 2020-12-16 NOTE — Telephone Encounter (Signed)
Received surgery paperwork from the [facility] office. Left a message for Koichi to call me to schedule surgery.

## 2020-12-19 ENCOUNTER — Telehealth: Payer: Self-pay | Admitting: Urology

## 2020-12-19 NOTE — Telephone Encounter (Signed)
DOS - 01/07/21  METATARSAL OSTEOTOMY 4TH LEFT --- [id number] HAMMERTOE REPAIR 4TH LEFT --- [id number] EXC BENIGN LESION LEFT --- [id number]   [employer] EFFECTIVE DATE - 06/01/19   SPOKE WITH RENZ P WITH [employer] AND SHE STATED THAT FOR CPT CODES 16109, [id number] AND [id number] NO PRIOR AUTH REQUIRED.  REF # [id number]

## 2020-12-22 NOTE — Progress Notes (Signed)
  Subjective:  Patient ID: [name], male    DOB: November 03, 1970,  MRN: [id number]  No chief complaint on file.  50 y.o. male presents with the above complaint. States he was scheduled to have surgery in May but was sick and had to reschedule. Reports 8/10 sharp pain - constant. Denies redness, reports swelling and hard skin. Has been soaking which has helped.   Objective:  Physical Exam: warm, good capillary refill, no trophic changes or ulcerative lesions, normal DP and PT pulses and normal sensory exam. Left Foot: POP left 4th met with punctate keratosis, lesion medial hallux IPJ   Assessment:   1. Metatarsal deformity, left   2. Hammer toe of left foot   3. Benign skin lesion    Plan:  Patient was evaluated and treated and all questions answered.  Punctate Keratosis, met deformity. -Patient would like to proceed with surgery as previously discussed -Planned procedures: Left 4th metatarsal osteotomy, correction of hammertoe with pin fixation, excision of skin lesion -Identified risk factors: Tobacco use -DME dispensed for post-op use: none   No follow-ups on file.

## 2020-12-25 DIAGNOSIS — M79605 Pain in left leg: Secondary | ICD-10-CM | POA: Diagnosis not present

## 2020-12-25 DIAGNOSIS — Z79899 Other long term (current) drug therapy: Secondary | ICD-10-CM | POA: Diagnosis not present

## 2020-12-25 DIAGNOSIS — F17210 Nicotine dependence, cigarettes, uncomplicated: Secondary | ICD-10-CM | POA: Diagnosis not present

## 2020-12-25 DIAGNOSIS — M542 Cervicalgia: Secondary | ICD-10-CM | POA: Diagnosis not present

## 2020-12-25 DIAGNOSIS — R69 Illness, unspecified: Secondary | ICD-10-CM | POA: Diagnosis not present

## 2020-12-25 DIAGNOSIS — M545 Low back pain, unspecified: Secondary | ICD-10-CM | POA: Diagnosis not present

## 2020-12-25 DIAGNOSIS — G894 Chronic pain syndrome: Secondary | ICD-10-CM | POA: Diagnosis not present

## 2020-12-30 DIAGNOSIS — Z79899 Other long term (current) drug therapy: Secondary | ICD-10-CM | POA: Diagnosis not present

## 2021-01-07 ENCOUNTER — Other Ambulatory Visit: Payer: Self-pay | Admitting: Podiatry

## 2021-01-07 ENCOUNTER — Encounter: Payer: Self-pay | Admitting: Podiatry

## 2021-01-07 DIAGNOSIS — M21542 Acquired clubfoot, left foot: Secondary | ICD-10-CM | POA: Diagnosis not present

## 2021-01-07 DIAGNOSIS — M2042 Other hammer toe(s) (acquired), left foot: Secondary | ICD-10-CM | POA: Diagnosis not present

## 2021-01-07 DIAGNOSIS — L989 Disorder of the skin and subcutaneous tissue, unspecified: Secondary | ICD-10-CM | POA: Diagnosis not present

## 2021-01-07 DIAGNOSIS — M7742 Metatarsalgia, left foot: Secondary | ICD-10-CM | POA: Diagnosis not present

## 2021-01-07 MED ORDER — OXYCODONE-ACETAMINOPHEN 10-325 MG PO TABS: 1.0000 | ORAL_TABLET | ORAL | 0 refills | Status: DC | PRN

## 2021-01-07 MED ORDER — CLINDAMYCIN HCL 150 MG PO CAPS: 150.0000 mg | ORAL_CAPSULE | Freq: Two times a day (BID) | ORAL | 0 refills | Status: AC

## 2021-01-07 NOTE — Progress Notes (Signed)
Rx sent to pharmacy for outpatient surgery. °

## 2021-01-08 ENCOUNTER — Telehealth: Payer: Self-pay | Admitting: Podiatry

## 2021-01-08 MED ORDER — OXYCODONE HCL 15 MG PO TABS: 15.0000 mg | ORAL_TABLET | ORAL | 0 refills | Status: DC | PRN

## 2021-01-08 NOTE — Telephone Encounter (Signed)
Called patient and left message informing of new Rx

## 2021-01-08 NOTE — Telephone Encounter (Signed)
Pain medication was sent yesterday and confirmed by hte pharmacy. Advise he check with them.  oxyCODONE-acetaminophen (PERCOCET) 10-325 MG tablet 20 tablet 0 01/07/2021    Sig - Route: Take 1 tablet by mouth every 4 (four) hours as needed for pain. - Oral   Sent to pharmacy as: oxyCODONE-acetaminophen (PERCOCET) 10-325 MG tablet   Earliest Fill Date: 01/07/2021   E-Prescribing Status: Receipt confirmed by pharmacy (01/07/2021 11:33 AM EDT)

## 2021-01-08 NOTE — Telephone Encounter (Signed)
Pt notified/reb °

## 2021-01-08 NOTE — Addendum Note (Signed)
Addended by: Hardie Pulley on: 01/08/2021 05:34 PM   Modules accepted: Orders

## 2021-01-08 NOTE — Telephone Encounter (Signed)
Pt states he was not given pain med rx when d/c yesterday. [facility]

## 2021-01-08 NOTE — Telephone Encounter (Signed)
Pharmacy calling to inform Dr March Rummage that patient already has a prescription for percocet from another provider. Wants to know if it will be ok to hold off? Please advise.

## 2021-01-15 ENCOUNTER — Encounter: Payer: Medicare HMO | Admitting: Podiatry

## 2021-01-15 ENCOUNTER — Telehealth: Payer: Self-pay | Admitting: Urology

## 2021-01-15 MED ORDER — OXYCODONE HCL 15 MG PO TABS: 15.0000 mg | ORAL_TABLET | ORAL | 0 refills | Status: DC | PRN

## 2021-01-15 NOTE — Telephone Encounter (Signed)
Pt is wanting a refill on pain medicine. Please Advise

## 2021-01-15 NOTE — Addendum Note (Signed)
Addended by: Hardie Pulley on: 01/15/2021 05:16 PM   Modules accepted: Orders

## 2021-01-19 ENCOUNTER — Other Ambulatory Visit: Payer: Self-pay

## 2021-01-19 ENCOUNTER — Ambulatory Visit (INDEPENDENT_AMBULATORY_CARE_PROVIDER_SITE_OTHER): Payer: Medicare HMO

## 2021-01-19 ENCOUNTER — Ambulatory Visit (INDEPENDENT_AMBULATORY_CARE_PROVIDER_SITE_OTHER): Payer: Medicare HMO | Admitting: Podiatry

## 2021-01-19 DIAGNOSIS — M2042 Other hammer toe(s) (acquired), left foot: Secondary | ICD-10-CM

## 2021-01-19 DIAGNOSIS — M21962 Unspecified acquired deformity of left lower leg: Secondary | ICD-10-CM | POA: Diagnosis not present

## 2021-01-19 MED ORDER — OXYCODONE HCL 15 MG PO TABS: 15.0000 mg | ORAL_TABLET | ORAL | 0 refills | Status: DC | PRN

## 2021-01-19 NOTE — Progress Notes (Addendum)
  Subjective:  Patient ID: [name], male    DOB: June 14, 1970,  MRN: [id number]  No chief complaint on file.  DOS: 01/07/21 Procedure: Left 4th met osteotomy, 4th hammertoe correction   50 y.o. male presents with the above complaint. History confirmed with patient. States he hit is left big toe yesterday. Having a lot of constant pain in the 4th toe area. Denies other issues.  Objective:  Physical Exam: tenderness at the surgical site, local edema noted, and calf supple, nontender. Incision: healing well, no significant drainage, no dehiscence, no significant erythema  No images are attached to the encounter.  Radiographs: X-ray of the left foot: consistent with post-op state, with good alignment and no evidence of hardware complication  Assessment:   1. Metatarsal deformity, left   2. Hammer toe of left foot    Plan:  Patient was evaluated and treated and all questions answered.  Post-operative State -XR reviewed with patient -Dressing applied consisting of sterile gauze, kerlix, and ACE bandage -WBAT in CAM boot -Refill pain medication now. -XRs needed at follow-up: none  No follow-ups on file.

## 2021-01-19 NOTE — Addendum Note (Signed)
Addended by: Hardie Pulley on: 01/19/2021 11:05 AM   Modules accepted: Orders

## 2021-01-26 ENCOUNTER — Telehealth: Payer: Self-pay | Admitting: Podiatry

## 2021-01-26 ENCOUNTER — Other Ambulatory Visit: Payer: Self-pay | Admitting: Podiatry

## 2021-01-26 MED ORDER — OXYCODONE-ACETAMINOPHEN 10-325 MG PO TABS: 1.0000 | ORAL_TABLET | ORAL | 0 refills | Status: DC | PRN

## 2021-01-26 NOTE — Telephone Encounter (Signed)
Patient is requesting pain meds

## 2021-01-29 ENCOUNTER — Encounter: Payer: Medicare HMO | Admitting: Podiatry

## 2021-02-04 DIAGNOSIS — M79605 Pain in left leg: Secondary | ICD-10-CM | POA: Diagnosis not present

## 2021-02-04 DIAGNOSIS — Z79899 Other long term (current) drug therapy: Secondary | ICD-10-CM | POA: Diagnosis not present

## 2021-02-04 DIAGNOSIS — M545 Low back pain, unspecified: Secondary | ICD-10-CM | POA: Diagnosis not present

## 2021-02-04 DIAGNOSIS — R69 Illness, unspecified: Secondary | ICD-10-CM | POA: Diagnosis not present

## 2021-02-04 DIAGNOSIS — F17210 Nicotine dependence, cigarettes, uncomplicated: Secondary | ICD-10-CM | POA: Diagnosis not present

## 2021-02-04 DIAGNOSIS — M542 Cervicalgia: Secondary | ICD-10-CM | POA: Diagnosis not present

## 2021-02-04 DIAGNOSIS — G894 Chronic pain syndrome: Secondary | ICD-10-CM | POA: Diagnosis not present

## 2021-02-09 ENCOUNTER — Other Ambulatory Visit: Payer: Self-pay

## 2021-02-09 ENCOUNTER — Ambulatory Visit (INDEPENDENT_AMBULATORY_CARE_PROVIDER_SITE_OTHER): Payer: Medicare HMO | Admitting: Podiatry

## 2021-02-09 ENCOUNTER — Ambulatory Visit (INDEPENDENT_AMBULATORY_CARE_PROVIDER_SITE_OTHER): Payer: Medicare HMO

## 2021-02-09 DIAGNOSIS — M2042 Other hammer toe(s) (acquired), left foot: Secondary | ICD-10-CM

## 2021-02-09 DIAGNOSIS — M21962 Unspecified acquired deformity of left lower leg: Secondary | ICD-10-CM

## 2021-02-09 MED ORDER — OXYCODONE HCL 15 MG PO TABS: 15.0000 mg | ORAL_TABLET | ORAL | 0 refills | Status: DC | PRN

## 2021-02-09 NOTE — Progress Notes (Signed)
  Subjective:  Patient ID: [name], male    DOB: 05-24-1971,  MRN: [id number]  Chief Complaint  Patient presents with   Routine Post Op    POV #2 -pt deneis N/V/F/Ch -pt stats,: pain is the same, constant no change; 9/10." - foot feels swollen tx: boot, pain med, elevation    DOS: 01/07/21 Procedure: Left 4th met osteotomy, 4th hammertoe correction   50 y.o. male presents with the above complaint. History confirmed with patient. Still having a lot of pain, thinks the pin may have come out a little bit. Requesting refill of pain medication today.  Objective:  Physical Exam: tenderness at the surgical site, local edema noted, and calf supple, nontender. Incision: healing well, no significant drainage, no dehiscence, no significant erythema  No images are attached to the encounter.  Radiographs: X-ray of the left foot: consistent with post-op state, with good alignment and progressive healing of bone, with pin backing out noted.  Assessment:   1. Hammer toe of left foot   2. Metatarsal deformity, left    Plan:  Patient was evaluated and treated and all questions answered.  Post-operative State -XR reviewed with patient -Sutures removed -Ok to start showering at this time. Advised they cannot soak. -Dressing applied consisting of sterile gauze, kerlix, and ACE bandage -WBAT in CAM boot -Pain medication refilled -Given pain will not progress out of boot at this time, allow him to continue to wear his boot, plan to transition to surgical shoe at next visit. -XRs needed at follow-up: 3 view Foot  Return in about 2 weeks (around 02/23/2021) for Post-Op (with XRs).

## 2021-02-12 DIAGNOSIS — F17210 Nicotine dependence, cigarettes, uncomplicated: Secondary | ICD-10-CM | POA: Diagnosis not present

## 2021-02-12 DIAGNOSIS — M79605 Pain in left leg: Secondary | ICD-10-CM | POA: Diagnosis not present

## 2021-02-12 DIAGNOSIS — M545 Low back pain, unspecified: Secondary | ICD-10-CM | POA: Diagnosis not present

## 2021-02-12 DIAGNOSIS — M542 Cervicalgia: Secondary | ICD-10-CM | POA: Diagnosis not present

## 2021-02-12 DIAGNOSIS — R69 Illness, unspecified: Secondary | ICD-10-CM | POA: Diagnosis not present

## 2021-02-12 DIAGNOSIS — Z79899 Other long term (current) drug therapy: Secondary | ICD-10-CM | POA: Diagnosis not present

## 2021-02-12 DIAGNOSIS — F1120 Opioid dependence, uncomplicated: Secondary | ICD-10-CM | POA: Diagnosis not present

## 2021-02-16 ENCOUNTER — Telehealth: Payer: Self-pay | Admitting: Urology

## 2021-02-16 MED ORDER — OXYCODONE HCL 15 MG PO TABS: 15.0000 mg | ORAL_TABLET | ORAL | 0 refills | Status: DC | PRN

## 2021-02-16 NOTE — Addendum Note (Signed)
Addended by: Hardie Pulley on: 02/16/2021 05:20 PM   Modules accepted: Orders

## 2021-02-16 NOTE — Telephone Encounter (Signed)
Pt is requesting a refill on pain medicine.

## 2021-02-23 ENCOUNTER — Telehealth: Payer: Self-pay | Admitting: Podiatry

## 2021-02-23 MED ORDER — OXYCODONE HCL 15 MG PO TABS: 15.0000 mg | ORAL_TABLET | ORAL | 0 refills | Status: DC | PRN

## 2021-02-23 NOTE — Addendum Note (Signed)
Addended bySherryle Lis, Idriss Quackenbush R on: 02/23/2021 05:31 PM   Modules accepted: Orders

## 2021-02-23 NOTE — Telephone Encounter (Signed)
*  CORRECTION*  Pharmacy is [facility] not [facility]

## 2021-02-23 NOTE — Telephone Encounter (Signed)
Price pt-vac Pt req pain med refill [facility] Has POV 02-26-21

## 2021-02-26 ENCOUNTER — Encounter: Payer: Medicare HMO | Admitting: Podiatry

## 2021-02-26 ENCOUNTER — Telehealth: Payer: Self-pay | Admitting: *Deleted

## 2021-02-26 NOTE — Telephone Encounter (Signed)
Called and spoke with the patient due to the patient had to reschedule his appointment due to the doctor was out of the office and patient stated that he got the pain medicine and the foot does throb and is icing and elevating and I stated to stay off of it as much as possible no more than 15 minutes on the hour and that swelling will occur for about 3 to 6 months and to call if any concerns or questions. Lattie Haw

## 2021-03-02 ENCOUNTER — Telehealth: Payer: Self-pay | Admitting: Podiatry

## 2021-03-02 MED ORDER — OXYCODONE-ACETAMINOPHEN 10-325 MG PO TABS: 1.0000 | ORAL_TABLET | ORAL | 0 refills | Status: DC | PRN

## 2021-03-02 NOTE — Telephone Encounter (Signed)
Pt req pain med refill [facility] [place]

## 2021-03-02 NOTE — Addendum Note (Signed)
Addended by: Hardie Pulley on: 03/02/2021 04:43 PM   Modules accepted: Orders

## 2021-03-05 ENCOUNTER — Encounter: Payer: Self-pay | Admitting: Podiatry

## 2021-03-05 ENCOUNTER — Ambulatory Visit (INDEPENDENT_AMBULATORY_CARE_PROVIDER_SITE_OTHER): Payer: Medicare HMO | Admitting: Podiatry

## 2021-03-05 ENCOUNTER — Ambulatory Visit (INDEPENDENT_AMBULATORY_CARE_PROVIDER_SITE_OTHER): Payer: Medicare HMO

## 2021-03-05 ENCOUNTER — Other Ambulatory Visit: Payer: Self-pay

## 2021-03-05 DIAGNOSIS — M21962 Unspecified acquired deformity of left lower leg: Secondary | ICD-10-CM

## 2021-03-05 MED ORDER — OXYCODONE HCL 15 MG PO TABS: 15.0000 mg | ORAL_TABLET | ORAL | 0 refills | Status: DC | PRN

## 2021-03-11 ENCOUNTER — Telehealth: Payer: Self-pay | Admitting: Podiatry

## 2021-03-11 NOTE — Telephone Encounter (Signed)
Pt req oxy refill [facility] in [place]

## 2021-03-12 MED ORDER — OXYCODONE HCL 15 MG PO TABS: 15.0000 mg | ORAL_TABLET | ORAL | 0 refills | Status: DC | PRN

## 2021-03-12 NOTE — Progress Notes (Signed)
  Subjective:  Patient ID: [name], male    DOB: February 06, 1971,  MRN: [id number]  Chief Complaint  Patient presents with   Routine Post Op    I am hurting some and I can not bend the toe on the left foot     DOS: 01/07/21 Procedure: Left 4th met osteotomy, 4th hammertoe correction   50 y.o. male presents with the above complaint. History confirmed with patient.  Pain is overall doing better but he still is having some pain  Objective:  Physical Exam: tenderness at the surgical site, local edema noted, and calf supple, nontender. Incision: Well-healed  No images are attached to the encounter.  Radiographs: X-ray of the left foot: consistent with post-op state, bone appears healed Assessment:   1. Metatarsal deformity, left    Plan:  Patient was evaluated and treated and all questions answered.  Post-operative State -Ok to start showering at this time. Advised they cannot soak. -WBAT in Surgical shoe -Surgical shoe dispensed -Pain medication refilled -XRs needed at follow-up: 3 view Foot  Return in about 3 weeks (around 03/26/2021) for Post-Op (No XRs).

## 2021-03-12 NOTE — Addendum Note (Signed)
Addended by: Hardie Pulley on: 03/12/2021 12:55 PM   Modules accepted: Orders

## 2021-03-18 ENCOUNTER — Telehealth: Payer: Self-pay | Admitting: Podiatry

## 2021-03-18 ENCOUNTER — Telehealth: Payer: Self-pay

## 2021-03-18 MED ORDER — OXYCODONE HCL 15 MG PO TABS: 15.0000 mg | ORAL_TABLET | ORAL | 0 refills | Status: DC | PRN

## 2021-03-18 NOTE — Addendum Note (Signed)
Addended by: Hardie Pulley on: 03/18/2021 05:43 PM   Modules accepted: Orders

## 2021-03-18 NOTE — Telephone Encounter (Signed)
Previously addressed.

## 2021-03-18 NOTE — Telephone Encounter (Signed)
Pt called stating he had called earlier requesting a RF on his pain medication and has not heard anything back yet. Pt states he stumped his toe and is in severe pain and needs something to take. Please advice

## 2021-03-18 NOTE — Telephone Encounter (Signed)
Pt req pain med refill [facility] [place]

## 2021-03-26 ENCOUNTER — Ambulatory Visit (INDEPENDENT_AMBULATORY_CARE_PROVIDER_SITE_OTHER): Payer: Medicare HMO | Admitting: Podiatry

## 2021-03-26 ENCOUNTER — Other Ambulatory Visit: Payer: Self-pay

## 2021-03-26 ENCOUNTER — Encounter: Payer: Self-pay | Admitting: Podiatry

## 2021-03-26 ENCOUNTER — Ambulatory Visit (INDEPENDENT_AMBULATORY_CARE_PROVIDER_SITE_OTHER): Payer: Medicare HMO

## 2021-03-26 DIAGNOSIS — L989 Disorder of the skin and subcutaneous tissue, unspecified: Secondary | ICD-10-CM

## 2021-03-26 DIAGNOSIS — M21962 Unspecified acquired deformity of left lower leg: Secondary | ICD-10-CM | POA: Diagnosis not present

## 2021-03-26 DIAGNOSIS — M2042 Other hammer toe(s) (acquired), left foot: Secondary | ICD-10-CM

## 2021-03-26 MED ORDER — OXYCODONE HCL 15 MG PO TABS: 15.0000 mg | ORAL_TABLET | ORAL | 0 refills | Status: DC | PRN

## 2021-03-27 NOTE — Progress Notes (Signed)
  Subjective:  Patient ID: [name], male    DOB: November 12, 1970,  MRN: [id number]  Chief Complaint  Patient presents with   Routine Post Op    The left foot hurts with pressure and with walking and I have been using a cane and the 4th toe is crooked on the left     DOS: 01/07/21 Procedure: Left 4th met osteotomy, 4th hammertoe correction   50 y.o. male presents with the above complaint. History confirmed with patient.     Objective:  Physical Exam: tenderness at the surgical site, local edema noted, and calf supple, nontender. Incision: Well-healed  No images are attached to the encounter.  Radiographs: X-ray of the left foot: consistent with post-op state, bone appears healed Assessment:   1. Metatarsal deformity, left   2. Hammer toe of left foot   3. Benign skin lesion    Plan:  Patient was evaluated and treated and all questions answered.  Post-operative State -Having more pain than expected but foot well appearing. Discussed his toe is rather straight it is the other does that are crooked. Transition as tolerated to normal shoegear -XRs needed at follow-up: 3 view Foot  Return in about 4 weeks (around 04/23/2021).

## 2021-04-01 ENCOUNTER — Telehealth: Payer: Self-pay

## 2021-04-01 NOTE — Telephone Encounter (Signed)
Pt called requesting a RF on pain medication

## 2021-04-02 MED ORDER — OXYCODONE-ACETAMINOPHEN 10-325 MG PO TABS: 1.0000 | ORAL_TABLET | ORAL | 0 refills | Status: DC | PRN

## 2021-04-02 NOTE — Addendum Note (Signed)
Addended by: Hardie Pulley on: 04/02/2021 08:46 AM   Modules accepted: Orders

## 2021-04-08 ENCOUNTER — Telehealth: Payer: Self-pay

## 2021-04-08 MED ORDER — OXYCODONE-ACETAMINOPHEN 5-325 MG PO TABS: 1.0000 | ORAL_TABLET | ORAL | 0 refills | Status: DC | PRN

## 2021-04-08 NOTE — Telephone Encounter (Signed)
Pt is calling requesting a RF on pain medication

## 2021-04-08 NOTE — Addendum Note (Signed)
Addended by: Hardie Pulley on: 04/08/2021 08:58 PM   Modules accepted: Orders

## 2021-04-09 NOTE — Telephone Encounter (Signed)
LVM to pt stated pain Rx has been RF

## 2021-04-15 ENCOUNTER — Telehealth: Payer: Self-pay | Admitting: Podiatry

## 2021-04-15 MED ORDER — OXYCODONE-ACETAMINOPHEN 5-325 MG PO TABS: 1.0000 | ORAL_TABLET | ORAL | 0 refills | Status: AC | PRN

## 2021-04-15 NOTE — Telephone Encounter (Signed)
Pt left vm req pain med refill-no pharmacy left

## 2021-04-15 NOTE — Addendum Note (Signed)
Addended by: Hardie Pulley on: 04/15/2021 05:33 PM   Modules accepted: Orders

## 2021-04-16 NOTE — Telephone Encounter (Signed)
Lm to notify pt/reb

## 2021-04-20 ENCOUNTER — Telehealth: Payer: Self-pay

## 2021-04-20 NOTE — Telephone Encounter (Signed)
Pt called and LVM requesting an increase on his pain medication. Pt states his feet are killing him and he can't walk.

## 2021-04-21 ENCOUNTER — Telehealth: Payer: Self-pay | Admitting: Podiatry

## 2021-04-21 NOTE — Telephone Encounter (Signed)
Pt req pain med refill [facility] [place] [facility]

## 2021-04-21 NOTE — Telephone Encounter (Signed)
Pt notified/reb °

## 2021-04-21 NOTE — Telephone Encounter (Signed)
Pt was notified.  

## 2021-04-21 NOTE — Telephone Encounter (Signed)
He is out of post-op period he will need to follow-up with his pain management

## 2021-04-27 ENCOUNTER — Other Ambulatory Visit: Payer: Self-pay

## 2021-04-27 ENCOUNTER — Ambulatory Visit (INDEPENDENT_AMBULATORY_CARE_PROVIDER_SITE_OTHER): Payer: Medicare HMO

## 2021-04-27 ENCOUNTER — Ambulatory Visit (INDEPENDENT_AMBULATORY_CARE_PROVIDER_SITE_OTHER): Payer: Medicare HMO | Admitting: Podiatry

## 2021-04-27 DIAGNOSIS — M2042 Other hammer toe(s) (acquired), left foot: Secondary | ICD-10-CM

## 2021-04-27 DIAGNOSIS — M21962 Unspecified acquired deformity of left lower leg: Secondary | ICD-10-CM

## 2021-04-27 NOTE — Progress Notes (Signed)
  Subjective:  Patient ID: [name], male    DOB: 25-Jul-1970,  MRN: [id number]  Chief Complaint  Patient presents with   Routine Post Op    POV -pt deneis N/V/F?Ch -pt states," hurts off and on, here and there." - occasional swelling tx; tylenol    DOS: 01/07/21 Procedure: Left 4th met osteotomy, 4th hammertoe correction   50 y.o. male presents with the above complaint. History confirmed with patient.     Objective:  Physical Exam: tenderness at the surgical site, local edema noted, and calf supple, nontender. Incision: Well-healed   Radiographs: X-ray of the left foot: consistent with post-op state, bone appears healed Assessment:   1. Hammer toe of left foot   2. Metatarsal deformity, left    Plan:  Patient was evaluated and treated and all questions answered.  Post-operative State -Having more pain than expected but foot well appearing. Discussed his toe is rather straight it is the other does that are crooked. Transition as tolerated to normal shoegear -XRs needed at follow-up: 3 view Foot  Return in about 6 weeks (around 06/08/2021) for Capsulitis.

## 2021-06-10 ENCOUNTER — Ambulatory Visit (INDEPENDENT_AMBULATORY_CARE_PROVIDER_SITE_OTHER): Payer: Medicare HMO | Admitting: Podiatry

## 2021-06-10 DIAGNOSIS — Z91199 Patient's noncompliance with other medical treatment and regimen due to unspecified reason: Secondary | ICD-10-CM

## 2021-06-10 NOTE — Progress Notes (Signed)
   Complete physical exam  Patient: [name]   DOB: 03/20/2001   51 y.o. Male  MRN: [id number]  Subjective:    No chief complaint on file.   [name] is a 51 y.o. male who presents today for a complete physical exam. She reports consuming a {diet types:17450} diet. {types:19826} She generally feels {DESC; WELL/FAIRLY WELL/POORLY:18703}. She reports sleeping {DESC; WELL/FAIRLY WELL/POORLY:18703}. She {does/does not:200015} have additional problems to discuss today.    Most recent fall risk assessment:    11/25/2021   10:42 AM  Fall Risk   Falls in the past year? 0  Number falls in past yr: 0  Injury with Fall? 0  Risk for fall due to : No Fall Risks  Follow up Falls evaluation completed     Most recent depression screenings:    11/25/2021   10:42 AM 10/16/2020   10:46 AM  PHQ 2/9 Scores  PHQ - 2 Score 0 0  PHQ- 9 Score 5     {VISON DENTAL STD PSA (Optional):27386}  {History (Optional):23778}  Patient Care Team: Jessup, Joy, NP as PCP - General (Nurse Practitioner)   Outpatient Medications Prior to Visit  Medication Sig   fluticasone (FLONASE) 50 MCG/ACT nasal spray Place 2 sprays into both nostrils in the morning and at bedtime. After 7 days, reduce to once daily.   norgestimate-ethinyl estradiol (SPRINTEC 28) 0.25-35 MG-MCG tablet Take 1 tablet by mouth daily.   Nystatin POWD Apply liberally to affected area 2 times per day   spironolactone (ALDACTONE) 100 MG tablet Take 1 tablet (100 mg total) by mouth daily.   No facility-administered medications prior to visit.    ROS        Objective:     There were no vitals taken for this visit. {Vitals History (Optional):23777}  Physical Exam   No results found for any visits on 12/31/21. {Show previous labs (optional):23779}    Assessment & Plan:    Routine Health Maintenance and Physical Exam  Immunization History  Administered Date(s) Administered   DTaP 06/03/1999, 07/30/1999,  10/08/1999, 06/23/2000, 01/07/2004   Hepatitis A 11/03/2007, 11/08/2008   Hepatitis B 03/21/1999, 04/28/1999, 10/08/1999   HiB (PRP-OMP) 06/03/1999, 07/30/1999, 10/08/1999, 06/23/2000   IPV 06/03/1999, 07/30/1999, 03/28/2000, 01/07/2004   Influenza,inj,Quad PF,6+ Mos 02/08/2014   Influenza-Unspecified 05/10/2012   MMR 03/28/2001, 01/07/2004   Meningococcal Polysaccharide 11/08/2011   Pneumococcal Conjugate-13 06/23/2000   Pneumococcal-Unspecified 10/08/1999, 12/22/1999   Tdap 11/08/2011   Varicella 03/28/2000, 11/03/2007    Health Maintenance  Topic Date Due   HIV Screening  Never done   Hepatitis C Screening  Never done   INFLUENZA VACCINE  12/29/2021   PAP-Cervical Cytology Screening  12/31/2021 (Originally 03/19/2020)   PAP SMEAR-Modifier  12/31/2021 (Originally 03/19/2020)   TETANUS/TDAP  12/31/2021 (Originally 11/07/2021)   HPV VACCINES  Discontinued   COVID-19 Vaccine  Discontinued    Discussed health benefits of physical activity, and encouraged her to engage in regular exercise appropriate for her age and condition.  Problem List Items Addressed This Visit   None Visit Diagnoses     Annual physical exam    -  Primary   Cervical cancer screening       Need for Tdap vaccination          No follow-ups on file.     Joy Jessup, NP   

## 2021-06-11 ENCOUNTER — Ambulatory Visit: Payer: Medicare HMO | Admitting: Podiatry

## 2021-08-04 ENCOUNTER — Encounter: Payer: Self-pay | Admitting: Gastroenterology

## 2021-11-13 ENCOUNTER — Other Ambulatory Visit (HOSPITAL_COMMUNITY): Payer: Self-pay | Admitting: Pain Medicine

## 2021-11-13 ENCOUNTER — Other Ambulatory Visit: Payer: Self-pay | Admitting: Pain Medicine
